# Patient Record
Sex: Female | Born: 1945 | Race: White | Hispanic: No | Smoking: Never smoker
Health system: Southern US, Community
[De-identification: ages and names within clinical notes are randomized; demographics above are authoritative.]

## PROBLEM LIST (undated history)

## (undated) DIAGNOSIS — R319 Hematuria, unspecified: Secondary | ICD-10-CM

## (undated) DIAGNOSIS — C3490 Malignant neoplasm of unspecified part of unspecified bronchus or lung: Secondary | ICD-10-CM

## (undated) DIAGNOSIS — R112 Nausea with vomiting, unspecified: Secondary | ICD-10-CM

## (undated) DIAGNOSIS — Z9889 Other specified postprocedural states: Secondary | ICD-10-CM

## (undated) DIAGNOSIS — J45909 Unspecified asthma, uncomplicated: Secondary | ICD-10-CM

## (undated) DIAGNOSIS — I10 Essential (primary) hypertension: Secondary | ICD-10-CM

## (undated) DIAGNOSIS — R51 Headache: Secondary | ICD-10-CM

## (undated) DIAGNOSIS — M1990 Unspecified osteoarthritis, unspecified site: Secondary | ICD-10-CM

## (undated) HISTORY — PX: TONSILLECTOMY: SUR1361

## (undated) HISTORY — PX: DILATION AND CURETTAGE OF UTERUS: SHX78

## (undated) HISTORY — PX: TUBAL LIGATION: SHX77

## (undated) HISTORY — PX: COLONOSCOPY: SHX174

---

## 1999-12-23 ENCOUNTER — Other Ambulatory Visit: Admission: RE | Admit: 1999-12-23 | Discharge: 1999-12-23 | Payer: Self-pay | Admitting: Obstetrics and Gynecology

## 2000-12-18 ENCOUNTER — Other Ambulatory Visit: Admission: RE | Admit: 2000-12-18 | Discharge: 2000-12-18 | Payer: Self-pay | Admitting: Obstetrics and Gynecology

## 2002-05-19 ENCOUNTER — Other Ambulatory Visit: Admission: RE | Admit: 2002-05-19 | Discharge: 2002-05-19 | Payer: Self-pay | Admitting: Family Medicine

## 2003-05-11 ENCOUNTER — Other Ambulatory Visit: Admission: RE | Admit: 2003-05-11 | Discharge: 2003-05-11 | Payer: Self-pay | Admitting: Family Medicine

## 2004-06-17 ENCOUNTER — Ambulatory Visit: Payer: Self-pay | Admitting: Family Medicine

## 2004-08-22 ENCOUNTER — Ambulatory Visit: Payer: Self-pay | Admitting: Family Medicine

## 2004-08-27 ENCOUNTER — Other Ambulatory Visit
Admission: RE | Admit: 2004-08-27 | Discharge: 2004-08-27 | Payer: Self-pay | Admitting: Certified Registered Nurse Anesthetist

## 2004-08-27 ENCOUNTER — Ambulatory Visit: Payer: Self-pay | Admitting: Family Medicine

## 2004-09-10 ENCOUNTER — Ambulatory Visit: Payer: Self-pay | Admitting: Family Medicine

## 2005-07-04 ENCOUNTER — Ambulatory Visit: Payer: Self-pay | Admitting: Family Medicine

## 2005-08-04 ENCOUNTER — Encounter: Payer: Self-pay | Admitting: Family Medicine

## 2005-09-30 ENCOUNTER — Ambulatory Visit: Payer: Self-pay | Admitting: Family Medicine

## 2005-10-15 ENCOUNTER — Ambulatory Visit: Payer: Self-pay | Admitting: Family Medicine

## 2005-10-15 ENCOUNTER — Other Ambulatory Visit: Admission: RE | Admit: 2005-10-15 | Discharge: 2005-10-15 | Payer: Self-pay | Admitting: Family Medicine

## 2005-10-15 ENCOUNTER — Encounter: Payer: Self-pay | Admitting: Family Medicine

## 2005-10-30 ENCOUNTER — Ambulatory Visit: Payer: Self-pay | Admitting: Gastroenterology

## 2005-12-01 ENCOUNTER — Ambulatory Visit: Payer: Self-pay | Admitting: Gastroenterology

## 2006-05-25 ENCOUNTER — Ambulatory Visit: Payer: Self-pay | Admitting: Family Medicine

## 2006-05-28 ENCOUNTER — Ambulatory Visit: Payer: Self-pay | Admitting: Family Medicine

## 2006-06-04 ENCOUNTER — Ambulatory Visit: Payer: Self-pay | Admitting: Family Medicine

## 2006-06-23 ENCOUNTER — Ambulatory Visit: Payer: Self-pay | Admitting: Family Medicine

## 2006-08-25 ENCOUNTER — Ambulatory Visit: Payer: Self-pay | Admitting: Family Medicine

## 2006-12-18 ENCOUNTER — Ambulatory Visit: Payer: Self-pay | Admitting: Family Medicine

## 2007-03-08 ENCOUNTER — Encounter: Payer: Self-pay | Admitting: Family Medicine

## 2007-04-12 ENCOUNTER — Encounter: Payer: Self-pay | Admitting: Family Medicine

## 2007-04-12 DIAGNOSIS — J45909 Unspecified asthma, uncomplicated: Secondary | ICD-10-CM | POA: Insufficient documentation

## 2007-04-12 DIAGNOSIS — I10 Essential (primary) hypertension: Secondary | ICD-10-CM

## 2007-04-12 DIAGNOSIS — K5730 Diverticulosis of large intestine without perforation or abscess without bleeding: Secondary | ICD-10-CM | POA: Insufficient documentation

## 2007-06-10 ENCOUNTER — Ambulatory Visit: Payer: Self-pay | Admitting: Family Medicine

## 2007-07-26 ENCOUNTER — Telehealth: Payer: Self-pay | Admitting: Family Medicine

## 2007-11-04 ENCOUNTER — Ambulatory Visit: Payer: Self-pay | Admitting: Family Medicine

## 2007-11-04 DIAGNOSIS — N952 Postmenopausal atrophic vaginitis: Secondary | ICD-10-CM

## 2007-11-04 LAB — CONVERTED CEMR LAB
AST: 21 units/L (ref 0–37)
Albumin: 4 g/dL (ref 3.5–5.2)
Alkaline Phosphatase: 66 units/L (ref 39–117)
BUN: 24 mg/dL — ABNORMAL HIGH (ref 6–23)
Bilirubin, Direct: 0.1 mg/dL (ref 0.0–0.3)
Chloride: 104 meq/L (ref 96–112)
Eosinophils Absolute: 0.1 10*3/uL (ref 0.0–0.7)
Eosinophils Relative: 2.6 % (ref 0.0–5.0)
GFR calc Af Amer: 72 mL/min
GFR calc non Af Amer: 60 mL/min
HDL: 56.2 mg/dL (ref >39.0)
MCV: 91.9 fL (ref 78.0–100.0)
Monocytes Relative: 8.5 % (ref 3.0–12.0)
Neutrophils Relative %: 51.1 % (ref 43.0–77.0)
Nitrite: NEGATIVE
Platelets: 203 10*3/uL (ref 150–400)
Potassium: 5.2 meq/L — ABNORMAL HIGH (ref 3.5–5.1)
Protein, U semiquant: NEGATIVE
RDW: 12.5 % (ref 11.5–14.6)
Sodium: 143 meq/L (ref 135–145)
Total CHOL/HDL Ratio: 3.5
Triglycerides: 60 mg/dL (ref 0–149)
VLDL: 12 mg/dL (ref 0–40)
WBC Urine, dipstick: NEGATIVE
WBC: 4.9 10*3/uL (ref 4.5–10.5)

## 2007-11-05 ENCOUNTER — Telehealth: Payer: Self-pay | Admitting: Family Medicine

## 2007-12-14 ENCOUNTER — Encounter: Payer: Self-pay | Admitting: Family Medicine

## 2007-12-15 ENCOUNTER — Encounter: Payer: Self-pay | Admitting: Family Medicine

## 2007-12-15 ENCOUNTER — Other Ambulatory Visit: Admission: RE | Admit: 2007-12-15 | Discharge: 2007-12-15 | Payer: Self-pay | Admitting: Family Medicine

## 2007-12-15 ENCOUNTER — Ambulatory Visit: Payer: Self-pay | Admitting: Family Medicine

## 2007-12-15 DIAGNOSIS — R319 Hematuria, unspecified: Secondary | ICD-10-CM

## 2008-01-03 ENCOUNTER — Encounter: Payer: Self-pay | Admitting: Family Medicine

## 2008-01-03 ENCOUNTER — Ambulatory Visit: Payer: Self-pay | Admitting: Internal Medicine

## 2008-01-20 ENCOUNTER — Telehealth: Payer: Self-pay | Admitting: *Deleted

## 2008-05-11 ENCOUNTER — Ambulatory Visit: Payer: Self-pay | Admitting: Family Medicine

## 2008-09-13 ENCOUNTER — Encounter: Payer: Self-pay | Admitting: Family Medicine

## 2008-10-20 ENCOUNTER — Ambulatory Visit: Payer: Self-pay | Admitting: Family Medicine

## 2008-10-20 DIAGNOSIS — R209 Unspecified disturbances of skin sensation: Secondary | ICD-10-CM | POA: Insufficient documentation

## 2008-10-20 LAB — CONVERTED CEMR LAB
Albumin: 4.1 g/dL (ref 3.5–5.2)
Alkaline Phosphatase: 65 units/L (ref 39–117)
BUN: 17 mg/dL (ref 6–23)
Basophils Absolute: 0 10*3/uL (ref 0.0–0.1)
Basophils Relative: 0.6 % (ref 0.0–3.0)
CO2: 34 meq/L — ABNORMAL HIGH (ref 19–32)
Chloride: 102 meq/L (ref 96–112)
Creatinine, Ser: 1 mg/dL (ref 0.4–1.2)
Eosinophils Absolute: 0.1 10*3/uL (ref 0.0–0.7)
Glucose, Bld: 134 mg/dL — ABNORMAL HIGH (ref 70–99)
HCT: 41.9 % (ref 36.0–46.0)
Hemoglobin: 14.5 g/dL (ref 12.0–15.0)
Lymphocytes Relative: 24.9 % (ref 12.0–46.0)
Lymphs Abs: 1.6 10*3/uL (ref 0.7–4.0)
MCHC: 34.6 g/dL (ref 30.0–36.0)
MCV: 92.2 fL (ref 78.0–100.0)
Monocytes Absolute: 0.3 10*3/uL (ref 0.1–1.0)
Neutro Abs: 4.5 10*3/uL (ref 1.4–7.7)
Potassium: 4.4 meq/L (ref 3.5–5.1)
RBC: 4.55 M/uL (ref 3.87–5.11)
RDW: 12.5 % (ref 11.5–14.6)
TSH: 2.57 microintl units/mL (ref 0.35–5.50)
Total Protein: 6.7 g/dL (ref 6.0–8.3)

## 2008-10-25 ENCOUNTER — Encounter: Admission: RE | Admit: 2008-10-25 | Discharge: 2008-10-25 | Payer: Self-pay | Admitting: Family Medicine

## 2008-10-27 ENCOUNTER — Telehealth: Payer: Self-pay | Admitting: Family Medicine

## 2008-12-07 ENCOUNTER — Encounter: Payer: Self-pay | Admitting: Family Medicine

## 2008-12-18 ENCOUNTER — Encounter: Admission: RE | Admit: 2008-12-18 | Discharge: 2008-12-18 | Payer: Self-pay | Admitting: Neurological Surgery

## 2008-12-18 ENCOUNTER — Encounter: Payer: Self-pay | Admitting: Family Medicine

## 2008-12-28 ENCOUNTER — Encounter: Payer: Self-pay | Admitting: Family Medicine

## 2009-08-04 HISTORY — PX: LYMPH NODE BIOPSY: SHX201

## 2009-09-28 ENCOUNTER — Encounter: Payer: Self-pay | Admitting: Family Medicine

## 2010-01-03 ENCOUNTER — Ambulatory Visit: Payer: Self-pay | Admitting: Family Medicine

## 2010-01-03 LAB — CONVERTED CEMR LAB
Albumin: 3.9 g/dL (ref 3.5–5.2)
Basophils Absolute: 0 10*3/uL (ref 0.0–0.1)
Basophils Relative: 0.7 % (ref 0.0–3.0)
CO2: 30 meq/L (ref 19–32)
Chloride: 108 meq/L (ref 96–112)
Eosinophils Absolute: 0.2 10*3/uL (ref 0.0–0.7)
Glucose, Bld: 83 mg/dL (ref 70–99)
Glucose, Urine, Semiquant: NEGATIVE
Hemoglobin: 13.2 g/dL (ref 12.0–15.0)
Ketones, urine, test strip: NEGATIVE
Lymphs Abs: 1.9 10*3/uL (ref 0.7–4.0)
MCHC: 34.3 g/dL (ref 30.0–36.0)
MCV: 92.8 fL (ref 78.0–100.0)
Monocytes Absolute: 0.4 10*3/uL (ref 0.1–1.0)
Neutro Abs: 2.6 10*3/uL (ref 1.4–7.7)
Nitrite: NEGATIVE
RBC: 4.15 M/uL (ref 3.87–5.11)
RDW: 13.6 % (ref 11.5–14.6)
Sodium: 145 meq/L (ref 135–145)
Specific Gravity, Urine: >=1.03
TSH: 3.23 microintl units/mL (ref 0.35–5.50)
Total CHOL/HDL Ratio: 3
Total Protein: 6.4 g/dL (ref 6.0–8.3)
Triglycerides: 58 mg/dL (ref 0.0–149.0)
WBC Urine, dipstick: NEGATIVE
pH: 6

## 2010-01-08 ENCOUNTER — Ambulatory Visit: Payer: Self-pay | Admitting: Family Medicine

## 2010-01-08 ENCOUNTER — Other Ambulatory Visit: Admission: RE | Admit: 2010-01-08 | Discharge: 2010-01-08 | Payer: Self-pay | Admitting: Family Medicine

## 2010-01-09 ENCOUNTER — Encounter: Payer: Self-pay | Admitting: *Deleted

## 2010-01-10 ENCOUNTER — Encounter: Payer: Self-pay | Admitting: Family Medicine

## 2010-03-14 ENCOUNTER — Ambulatory Visit: Payer: Self-pay | Admitting: Thoracic Surgery (Cardiothoracic Vascular Surgery)

## 2010-03-19 ENCOUNTER — Ambulatory Visit: Payer: Self-pay | Admitting: Thoracic Surgery (Cardiothoracic Vascular Surgery)

## 2010-03-19 ENCOUNTER — Ambulatory Visit (HOSPITAL_COMMUNITY)
Admission: RE | Admit: 2010-03-19 | Discharge: 2010-03-19 | Payer: Self-pay | Admitting: Thoracic Surgery (Cardiothoracic Vascular Surgery)

## 2010-06-12 ENCOUNTER — Encounter: Payer: Self-pay | Admitting: Family Medicine

## 2010-06-12 ENCOUNTER — Ambulatory Visit: Payer: Self-pay | Admitting: Internal Medicine

## 2010-08-24 ENCOUNTER — Other Ambulatory Visit: Payer: Self-pay | Admitting: Thoracic Surgery (Cardiothoracic Vascular Surgery)

## 2010-08-24 DIAGNOSIS — R911 Solitary pulmonary nodule: Secondary | ICD-10-CM

## 2010-09-01 LAB — CONVERTED CEMR LAB: Pap Smear: NEGATIVE

## 2010-09-03 NOTE — Miscellaneous (Signed)
Summary: rx update  Medications Added ESTRACE 0.1 MG/GM  CREA (ESTRADIOL) apply 1 gram,   2 x week       Clinical Lists Changes  Medications: Changed medication from ESTRACE 0.1 MG/GM  CREA (ESTRADIOL) apply a small amout  2 x week to ESTRACE 0.1 MG/GM  CREA (ESTRADIOL) apply 1 gram,   2 x week - Signed Rx of ESTRACE 0.1 MG/GM  CREA (ESTRADIOL) apply 1 gram,   2 x week;  #3 x 11;  Signed;  Entered by: Kern Reap CMA (AAMA);  Authorized by: Roderick Pee MD;  Method used: Electronically to [facility]. #[id number]*, [address] [place], [place], [place]  [address], Ph: [phone], Fax: [phone]    Prescriptions: ESTRACE 0.1 MG/GM  CREA (ESTRADIOL) apply 1 gram,   2 x week  #3 x 11   Entered by:   Kern Reap CMA (AAMA)   Authorized by:   Roderick Pee MD   Signed by:   Kern Reap CMA (AAMA) on 01/10/2010   Method used:   Electronically to        [facility]. [phone]* (retail)       [address]       [place], [place]  [address]       Ph: [phone]       Fax: [phone]   RxID:   [id number]

## 2010-09-03 NOTE — Assessment & Plan Note (Signed)
Summary: CPX // RS   Vital Signs:  Patient profile:   65 year old female Menstrual status:  postmenopausal Height:      62.25 inches Weight:      142 pounds BMI:     25.86 Temp:     98.2 degrees F oral BP sitting:   140 / 80  (left arm) Cuff size:   regular  Vitals Entered By: Kern Reap CMA Duncan Dull) (January 08, 2010 2:56 PM) CC: cpx Is Patient Diabetic? No Pain Assessment Patient in pain? no          Menstrual Status postmenopausal Last PAP Result done   CC:  cpx.  History of Present Illness: Doris Barton is a 65 year old, married female, nonsmoker, who comes in today for evaluation of hypertension, her hypertension is treated with Zestoretic 5 -- 6.25 daily.  BP 140/80.  She uses Proventil p.r.n. for asthma.  She uses Estrace vaginal cream twice weekly for severe vaginal dryness.  She takes calcium, vitamin D, and aspirin tablet and walks on a regular basis.  Tetanus 2009, shingles 2009, seasonal flu 2010, mammogram February 2011, colonoscopy, 2002, normal BMD 01 normal  She continues to be involved in the music .   Her daughter Andrey Campanile, who lives in [place] as [place]  Allergies: No Known Drug Allergies  Past History:  Past medical, surgical, family and social histories (including risk factors) reviewed, and no changes noted (except as noted below).  Past Medical History: Reviewed history from 12/15/2007 and no changes required. Asthma Diverticulosis, colon Hypertension atrophic vaginitis  Family History: Reviewed history from 11/04/2007 and no changes required. her father, Dr. Yolande Jolly died of a CNS hemorrhage.  Her mother died in her 43s of Alzheimer's disease and mild diabetes and asthma.  No brothers two sisters one has allergic rhinitis and asthma.  The other one is obese and has diabetes.  Both have skin cancer  Social History: Reviewed history from 11/04/2007 and no changes required. Occupation: musician Married Never Smoked Alcohol use-no Drug  use-no Regular exercise-yes  Review of Systems      See HPI  Physical Exam  General:  Well-developed,well-nourished,in no acute distress; alert,appropriate and cooperative throughout examination Head:  Normocephalic and atraumatic without obvious abnormalities. No apparent alopecia or balding. Eyes:  No corneal or conjunctival inflammation noted. EOMI. Perrla. Funduscopic exam benign, without hemorrhages, exudates or papilledema. Vision grossly normal. Ears:  External ear exam shows no significant lesions or deformities.  Otoscopic examination reveals clear canals, tympanic membranes are intact bilaterally without bulging, retraction, inflammation or discharge. Hearing is grossly normal bilaterally. Nose:  External nasal examination shows no deformity or inflammation. Nasal mucosa are pink and moist without lesions or exudates. Mouth:  Oral mucosa and oropharynx without lesions or exudates.  Teeth in good repair. Neck:  No deformities, masses, or tenderness noted. Chest Wall:  No deformities, masses, or tenderness noted. Breasts:  No mass, nodules, thickening, tenderness, bulging, retraction, inflamation, nipple discharge or skin changes noted.   Lungs:  Normal respiratory effort, chest expands symmetrically. Lungs are clear to auscultation, no crackles or wheezes. Heart:  Normal rate and regular rhythm. S1 and S2 normal without gallop, murmur, click, rub or other extra sounds. Abdomen:  Bowel sounds positive,abdomen soft and non-tender without masses, organomegaly or hernias noted. Rectal:  No external abnormalities noted. Normal sphincter tone. No rectal masses or tenderness. Genitalia:  Pelvic Exam:        External: normal female genitalia without lesions or masses  Vagina: normal without lesions or masses        Cervix: normal without lesions or masses        Adnexa: normal bimanual exam without masses or fullness        Uterus: normal by palpation        Pap smear:  performed Msk:  No deformity or scoliosis noted of thoracic or lumbar spine.   Pulses:  R and L carotid,radial,femoral,dorsalis pedis and posterior tibial pulses are full and equal bilaterally Extremities:  No clubbing, cyanosis, edema, or deformity noted with normal full range of motion of all joints.   Neurologic:  No cranial nerve deficits noted. Station and gait are normal. Plantar reflexes are down-going bilaterally. DTRs are symmetrical throughout. Sensory, motor and coordinative functions appear intact. Skin:  Intact without suspicious lesions or rashes Cervical Nodes:  No lymphadenopathy noted Axillary Nodes:  No palpable lymphadenopathy Inguinal Nodes:  No significant adenopathy Psych:  Cognition and judgment appear intact. Alert and cooperative with normal attention span and concentration. No apparent delusions, illusions, hallucinations   Impression & Recommendations:  Problem # 1:  Screening PSA (ICD-V76.44) Assessment Unchanged  Problem # 2:  VAGINITIS, ATROPHIC, POSTMENOPAUSAL (ICD-627.3) Assessment: Improved  Her updated medication list for this problem includes:    Estrace 0.1 Mg/gm Crea (Estradiol) .Marland Kitchen... Apply 2 x week  Orders: Prescription Created Electronically [phone])  Problem # 3:  HYPERTENSION (ICD-401.9) Assessment: Improved  Her updated medication list for this problem includes:    Ziac 5-6.25 Mg Tabs (Bisoprolol-hydrochlorothiazide) ..... One by mouth daily  Orders: Prescription Created Electronically [id number]) EKG w/ Interpretation (93000)  Problem # 4:  ASTHMA (ICD-493.90) Assessment: Improved  Her updated medication list for this problem includes:    Proventil 90 Mcg/act Aers (Albuterol) ..... Use as directed  Orders: Prescription Created Electronically [phone])  Complete Medication List: 1)  Ziac 5-6.25 Mg Tabs (Bisoprolol-hydrochlorothiazide) .... One by mouth daily 2)  Proventil 90 Mcg/act Aers (Albuterol) .... Use as directed 3)   Multivitamins Tabs (Multiple vitamin) 4)  Aspirin 325 Mg Tbec (Aspirin) .... Take one tablet daily 5)  Estrace 0.1 Mg/gm Crea (Estradiol) .... Apply 2 x week  Patient Instructions: 1)  continue your current medications. 2)  Consider a foot consult with [facility]oni [facility] at [facility]. Prescriptions: ESTRACE 0.1 MG/GM  CREA (ESTRADIOL) apply 2 x week  #3 tubes x 11   Entered and Authorized by:   Roderick Pee MD   Signed by:   Roderick Pee MD on 01/08/2010   Method used:   Electronically to        [facility]. [phone]* (retail)       [address]       [place], [place]  [address]       Ph: [phone]       Fax: [phone]   RxID:   [id number] PROVENTIL 42 MCG/ACT  AERS (ALBUTEROL) use as directed  #1 x 1   Entered and Authorized by:   Roderick Pee MD   Signed by:   Roderick Pee MD on 01/08/2010   Method used:   Electronically to        [facility]. [phone]* (retail)       [address]       [place], [place]  [address]       Ph: [phone]  Fax: [phone]   RxID:   [id number] ZIAC 5-6.25 MG  TABS (BISOPROLOL-HYDROCHLOROTHIAZIDE) one by mouth daily  #100 x 3   Entered and Authorized by:   Roderick Pee MD   Signed by:   Roderick Pee MD on 01/08/2010   Method used:   Electronically to        [facility]. [phone]* (retail)       [address]       [place], [place]  [address]       Ph: [phone]       Fax: [phone]   RxID:   [id number]

## 2010-09-03 NOTE — Letter (Signed)
Summary: [facility]  [facility]   Imported By: Lanelle Bal 08/28/2009 11:18:56  _____________________________________________________________________  External Attachment:    Type:   Image     Comment:   External Document

## 2010-09-03 NOTE — Miscellaneous (Signed)
Summary: mammogram update   Clinical Lists Changes  Observations: Added new observation of MAMMOGRAM: normal (09/26/2009 8:58)      Preventive Care Screening  Mammogram:    Date:  09/26/2009    Results:  normal

## 2010-09-03 NOTE — Letter (Signed)
Summary: [facility]  [facility]   Imported By: Lanelle Bal 08/28/2009 11:20:46  _____________________________________________________________________  External Attachment:    Type:   Image     Comment:   External Document

## 2010-09-03 NOTE — Miscellaneous (Signed)
Summary: rx update  Medications Added ESTRACE 0.1 MG/GM  CREA (ESTRADIOL) apply a small amout  2 x week       Clinical Lists Changes  Medications: Changed medication from ESTRACE 0.1 MG/GM  CREA (ESTRADIOL) apply 2 x week to ESTRACE 0.1 MG/GM  CREA (ESTRADIOL) apply a small amout  2 x week - Signed Rx of ESTRACE 0.1 MG/GM  CREA (ESTRADIOL) apply a small amout  2 x week;  #3 x 11;  Signed;  Entered by: Kern Reap CMA (AAMA);  Authorized by: Roderick Pee MD;  Method used: Electronically to [facility]. #[id number]*, [address], [place], [place], [place]  [address], Ph: [phone], Fax: [phone]    Prescriptions: ESTRACE 0.1 MG/GM  CREA (ESTRADIOL) apply a small amout  2 x week  #3 x 11   Entered by:   Kern Reap CMA (AAMA)   Authorized by:   Roderick Pee MD   Signed by:   Kern Reap CMA (AAMA) on 01/09/2010   Method used:   Electronically to        [facility]. [phone]* (retail)       [address]       [place], [place]  [address]       Ph: [phone]       Fax: [phone]   RxID:   [id number]

## 2010-09-03 NOTE — Miscellaneous (Signed)
Summary: BONE DENSITY  Clinical Lists Changes  Orders: Added new Test order of T-Bone Densitometry (77080) - Signed Added new Test order of T-Lumbar Vertebral Assessment (77082) - Signed 

## 2010-09-12 ENCOUNTER — Encounter (INDEPENDENT_AMBULATORY_CARE_PROVIDER_SITE_OTHER): Payer: 59 | Admitting: Thoracic Surgery (Cardiothoracic Vascular Surgery)

## 2010-09-12 ENCOUNTER — Ambulatory Visit
Admission: RE | Admit: 2010-09-12 | Discharge: 2010-09-12 | Disposition: A | Discharge status: Home / Self Care | Payer: 59 | Source: Ambulatory Visit | Attending: Thoracic Surgery (Cardiothoracic Vascular Surgery) | Admitting: Thoracic Surgery (Cardiothoracic Vascular Surgery)

## 2010-09-12 DIAGNOSIS — D381 Neoplasm of uncertain behavior of trachea, bronchus and lung: Secondary | ICD-10-CM

## 2010-09-12 DIAGNOSIS — R911 Solitary pulmonary nodule: Secondary | ICD-10-CM

## 2010-09-13 ENCOUNTER — Encounter (INDEPENDENT_AMBULATORY_CARE_PROVIDER_SITE_OTHER): Payer: 59 | Admitting: Thoracic Surgery (Cardiothoracic Vascular Surgery)

## 2010-09-13 DIAGNOSIS — R599 Enlarged lymph nodes, unspecified: Secondary | ICD-10-CM

## 2010-09-13 DIAGNOSIS — D381 Neoplasm of uncertain behavior of trachea, bronchus and lung: Secondary | ICD-10-CM

## 2010-09-13 NOTE — Assessment & Plan Note (Signed)
OFFICE VISIT  LYNAE, PEDERSON DOB:  [birth year]                                        September 12, 2010 CHART #:  [id number]  HISTORY:  The patient is a 65 year old woman who I saw back in August, she was being worked up for hematuria at that time.  CT scan showed a 1- cm irregular nodule in the left lower lobe.  She also had some prominent mediastinal lymph nodes at the upper limits of normal.  We did a PET scan and all that was negative by PET for any hypermetabolic activity. She now returns for a 3-month followup scan with a plan to follow up again in 1 year, 18 months, and 2 years if things remain stable.  She says that her hematuria has resolved.  In the meantime, she had a cold last week she had been caring for her grandchildren that had the usual sneezing, runny nose, cough, low-grade fevers.  Other than that, she has been feeling completely well with no issues.  CURRENT MEDICATIONS: 1. Allergy shots. 2. Aspirin 325 mg daily. 3. Estrace 0.1 mg cream twice weekly. 4. Ziac 5/6.25 one tablet daily. 5. Multivitamin daily.  ALLERGIES:  She has no known drug allergies.  PHYSICAL EXAMINATION:  Vital Signs:  Her blood pressure is 177/88, pulse 63, respirations are 20, and oxygen saturation is 96% on room air. Neck:  There is no cervical or supraclavicular adenopathy.  Lungs: Clear with equal breath sounds.  DIAGNOSTIC TESTS:  CT of the chest is reviewed, and compared to her PET images from August, looking at the left lower lobe nodule looks about the same.  The mediastinal nodes maybe slightly increased in size, but talking about possibly a millimeter or 2 at the max, the scan has not been officially read yet.  IMPRESSION:  The patient is a 65 year old woman with a small left lower lobe nodule and some enlarged mediastinal nodes.  These appear relatively stable over the last 6 months.  I am awaiting the official radiology report and we will call the  patient with that.  Assuming things measure the same, we would plan to re-scan her in 6 months that would be a 1-year followup.  If Radiology feels that anything has grown significantly, then I could discuss for the possibility of more aggressive intervention in the form of biopsies with either endobronchial ultrasound or mediastinoscopy.  Salvatore Decent Dorris Fetch, M.D. Electronically Signed  SCH/MEDQ  D:  09/12/2010  T:  09/13/2010  Job:  [id number]  cc:   Tinnie Gens A. Tawanna Cooler, MD Courtney Paris, M.D.

## 2010-09-14 NOTE — Assessment & Plan Note (Signed)
OFFICE VISIT  Doris, Barton DOB:  [birth year]                                        September 13, 2010 CHART #:  [id number]  Doris Barton returns today.  We have got the report from her CT late after she had left the office yesterday afternoon, and the radiologist felt that the left lung nodule had increased slightly in size going from 10 to 12 mm and also felt that there was some enlargement of the mediastinal lymph nodes with precarinal node going from 1.3 x 1.3 to 1.5 x 1.9 cm and enlargement of the subcarinal node as well.  There also is a new "tree-in-bud" opacity in the right upper lobe that was not present on her PET CT back in August.  I reviewed these findings in detail with Doris Barton and her husband and explained in the differential diagnosis given the negative PET scan and slight growth, this is unlikely to be a malignant etiology, but there is a possibility that this could represent infectious etiology or sarcoidosis as well as an outside chance that there could be a very low-grade malignancy, but I think the first two are much more likely.  I have offered her the option since there was a questionable change in size of moving up to schedule and repeating a CT scan in 3 months versus going ahead and getting biopsies and cultures for a more definitive diagnosis.  I did discuss our options which would be to address the nodes and then possibly the lung lesions if necessary or to address all things at once.  I discussed the various options for approaching each of those with bronchoscopy, endobronchial ultrasound, mediastinoscopy, as well as VATS approach for excisional biopsy of the lung lesion.  My recommendation was that we address the lymph nodes and see if we can get an answer from that standpoint as there is less morbidity and recovery time and essentially no hospital stay with that approach.  I recommended that we proceed with bronchoscopy  and endobronchial ultrasound, and then if pathologist were not confident of getting a definitive diagnosis with the IVUS, we will go ahead and proceed in the same setting with mediastinoscopy.  I discussed in detail with them the nature of procedure, need for general anesthesia, expectation to do this on an outpatient basis.  We did discuss the risks, particularly in regard to mediastinoscopy, which I think likely will be necessary, including those risks associated with general anesthesia as well as the risk of pneumothorax, bleeding potentially and requiring a larger incision to control, recurrent nerve injury with hoarseness, or wound infection.  She understands and accepts these risks.  She wishes to go ahead and find out what process is going on and then we can refer her for appropriate treatment depending on whether this is infectious, inflammatory, or neoplastic in character.  We have tentatively scheduled her for Tuesday, September 24, 2010.  Again she would come in that morning and should be able to go on the same day.  Salvatore Decent Dorris Fetch, M.D. Electronically Signed  SCH/MEDQ  D:  09/13/2010  T:  09/14/2010  Job:  [id number]

## 2010-09-23 ENCOUNTER — Encounter (HOSPITAL_COMMUNITY)
Admission: RE | Admit: 2010-09-23 | Discharge: 2010-09-23 | Disposition: A | Discharge status: Home / Self Care | Payer: 59 | Source: Ambulatory Visit | Attending: Thoracic Surgery (Cardiothoracic Vascular Surgery) | Admitting: Thoracic Surgery (Cardiothoracic Vascular Surgery)

## 2010-09-23 ENCOUNTER — Other Ambulatory Visit: Payer: Self-pay | Admitting: Thoracic Surgery (Cardiothoracic Vascular Surgery)

## 2010-09-23 DIAGNOSIS — Z01810 Encounter for preprocedural cardiovascular examination: Secondary | ICD-10-CM | POA: Insufficient documentation

## 2010-09-23 DIAGNOSIS — Z01818 Encounter for other preprocedural examination: Secondary | ICD-10-CM | POA: Insufficient documentation

## 2010-09-23 DIAGNOSIS — Z9889 Other specified postprocedural states: Secondary | ICD-10-CM

## 2010-09-23 DIAGNOSIS — Z01812 Encounter for preprocedural laboratory examination: Secondary | ICD-10-CM | POA: Insufficient documentation

## 2010-09-23 LAB — COMPREHENSIVE METABOLIC PANEL
ALT: 19 U/L (ref 0–35)
Alkaline Phosphatase: 73 U/L (ref 39–117)
BUN: 19 mg/dL (ref 6–23)
CO2: 28 mEq/L (ref 19–32)
Chloride: 102 mEq/L (ref 96–112)
Glucose, Bld: 89 mg/dL (ref 70–99)
Potassium: 4.5 mEq/L (ref 3.5–5.1)
Total Bilirubin: 0.6 mg/dL (ref 0.3–1.2)

## 2010-09-23 LAB — TYPE AND SCREEN
ABO/RH(D): O POS
Antibody Screen: NEGATIVE

## 2010-09-23 LAB — CBC
HCT: 43 % (ref 36.0–46.0)
Hemoglobin: 14.8 g/dL (ref 12.0–15.0)
RBC: 4.76 MIL/uL (ref 3.87–5.11)
WBC: 7.3 10*3/uL (ref 4.0–10.5)

## 2010-09-24 ENCOUNTER — Other Ambulatory Visit: Payer: Self-pay | Admitting: Thoracic Surgery (Cardiothoracic Vascular Surgery)

## 2010-09-24 ENCOUNTER — Ambulatory Visit (HOSPITAL_COMMUNITY)
Admission: RE | Admit: 2010-09-24 | Discharge: 2010-09-24 | Disposition: A | Discharge status: Home / Self Care | Payer: 59 | Source: Ambulatory Visit | Attending: Thoracic Surgery (Cardiothoracic Vascular Surgery) | Admitting: Thoracic Surgery (Cardiothoracic Vascular Surgery)

## 2010-09-24 ENCOUNTER — Ambulatory Visit (HOSPITAL_COMMUNITY): Payer: 59

## 2010-09-24 DIAGNOSIS — Z01810 Encounter for preprocedural cardiovascular examination: Secondary | ICD-10-CM | POA: Insufficient documentation

## 2010-09-24 DIAGNOSIS — R911 Solitary pulmonary nodule: Secondary | ICD-10-CM | POA: Insufficient documentation

## 2010-09-24 DIAGNOSIS — Z01812 Encounter for preprocedural laboratory examination: Secondary | ICD-10-CM | POA: Insufficient documentation

## 2010-09-24 DIAGNOSIS — D381 Neoplasm of uncertain behavior of trachea, bronchus and lung: Secondary | ICD-10-CM

## 2010-09-24 DIAGNOSIS — J45909 Unspecified asthma, uncomplicated: Secondary | ICD-10-CM | POA: Insufficient documentation

## 2010-09-24 DIAGNOSIS — R599 Enlarged lymph nodes, unspecified: Secondary | ICD-10-CM

## 2010-09-24 DIAGNOSIS — I10 Essential (primary) hypertension: Secondary | ICD-10-CM | POA: Insufficient documentation

## 2010-09-24 DIAGNOSIS — Z01818 Encounter for other preprocedural examination: Secondary | ICD-10-CM | POA: Insufficient documentation

## 2010-09-27 LAB — CULTURE, RESPIRATORY W GRAM STAIN

## 2010-10-02 ENCOUNTER — Encounter (INDEPENDENT_AMBULATORY_CARE_PROVIDER_SITE_OTHER): Payer: Self-pay | Admitting: Thoracic Surgery (Cardiothoracic Vascular Surgery)

## 2010-10-02 DIAGNOSIS — D381 Neoplasm of uncertain behavior of trachea, bronchus and lung: Secondary | ICD-10-CM

## 2010-10-03 NOTE — Assessment & Plan Note (Signed)
OFFICE VISIT  Doris Barton, Doris Barton DOB:  Apr 14, 1946                                        October 02, 2010 CHART #:  [id number]  Doris Barton returns to discuss pathology.  She had a bronchoscopy, EBUS, and mediastinoscopy done on September 24, 2010.  She was originally referred last fall for a left lower lobe nodule and some very mild mediastinal adenopathy.  This was all negative on PET, so we elected to follow her with a repeat CT scan.  She had the repeat CT scan done in early February and it showed an increase in the size of mediastinal nodes and also possible slight increase in the size of his left lower lobe lung nodule.  There also was a new tree and bud infiltrate in the right lung.  We discussed this and I recommended to her that we proceed with some diagnostic sampling of her mediastinal nodes since those were increased in size, it was less invasive than the mass.  The left lower lobe nodule was relatively central, fairly large wedge resection to remove that.  We did that on September 24, 2010, with a bronchoscopy, endobronchial ultrasound, and cytologies, which were negative.  We then proceeded with mediastinoscopy.  [name] states that she is having minimal discomfort.  Her throat was sore for a couple of days, but she is really doing fine.  She is back to full activities.  On exam, her incision is healing well.  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  There was no evidence of malignancy or granulomatous disease.  We discussed our options regarding his left lower lobe mass, again this was negative by PET, showed very limited increase in size, could also be an intrapulmonary lymph node with the same process.  I offered her the option of repeating a CT in about 3 months versus doing VATS wedge resection of the nodule.  We discussed the pros and cons of each approach, the idea of having  a definitive rule out, unfortunately even if we do the wedge on the left lower lobe, she would still need a followup CT for the right lung infiltrate to make sure that is resolving.  We also still have cultures for fungal and AFB that are pending, although the initial results are negative.  After discussion, she wishes to wait and repeat a CT in June.  She is going to discuss this further with her husband this evening.  If she were to change her mind, she will call and we will get her scheduled for that.  Salvatore Decent Dorris Fetch, M.D. Electronically Signed  SCH/MEDQ  D:  10/02/2010  T:  10/03/2010  Job:  [id number]  cc:   Tinnie Gens A. Tawanna Cooler, MD Courtney Paris, M.D.

## 2010-10-08 NOTE — Op Note (Addendum)
NAME:  Doris Barton, SONNEBORN NO.:  [id number]  MEDICAL RECORD NO.:  [id number]           PATIENT TYPE:  O  LOCATION:  [facility]                         FACILITY:  [facility]  PHYSICIAN:  Salvatore Decent. Dorris Fetch, M.D.DATE OF BIRTH:  07/21/1946  DATE OF PROCEDURE:  09/24/2010 DATE OF DISCHARGE:  09/24/2010                              OPERATIVE REPORT   PREOPERATIVE DIAGNOSES:  Mediastinal adenopathy and left lower lobe nodule.  POSTOPERATIVE DIAGNOSES:  Mediastinal adenopathy and left lower lobe nodule.  PROCEDURES:  Bronchoscopy, endobronchial ultrasound, mediastinoscopy.  SURGEON:  Salvatore Decent. Dorris Fetch, MD  ASSISTANT:  None.  ANESTHESIA:  General.  FINDINGS:  Bronchoscopy within normal limits.  Endobronchial ultrasound specimens from level 4R and 7 nodes, rapid prep, atypical cells, level III sent for permanent cytology only.  Mediastinoscopy, enlarged anthracotic vascular nodes, not malignant in appearance.  Multiple biopsies sent.  Frozen section revealed reactive nodes with anthracosis. No malignancy seen.  CLINICAL NOTE:  Doris Barton is a 65 year old woman who was incidentally found to have a small left lung nodule during workup for hematuria back in August 2011.  CT of the chest showed some mediastinal adenopathy and a 1-cm irregular nodule in the left lower lobe.  PET showed no evidence of hypermetabolic activity in either the mediastinum or in the left lower lobe nodule.  A repeat CT was done now at 6 months and showed there has been some enlargement in the mediastinal nodes and also possibly some slight enlargement in the left lower lobe nodule.  The patient was advised to undergo a diagnostic procedure and advised her to start with bronchoscopy, endobronchial ultrasound, and possible mediastinoscopy.  The indications, risks, benefits, and alternatives were discussed in detail with the patient, she understood and accepted risks and agreed to  proceed.  OPERATIVE NOTE:  Doris Barton was brought to the preop holding area on September 24, 2010.  There, Anesthesia established intravenous access and then placed an arterial blood pressure monitoring line.  The patient was taken to the operating room, anesthetized, and intubated.  A flexible fiberoptic bronchoscopy was carried out via the endotracheal tube. There was normal endobronchial anatomy and no endobronchial lesions. The endobronchial ultrasound probe then was passed through the endotracheal tube using Doppler scanning to identify the blood vessels to prevent an inadvertent passage of the needle into the vessels.  Nodes were identified in the level IV location initially.  The needle was passed into the node, visualized on ultrasound, several passes were made.  The needle was withdrawn.  Specimens were sent for quick prep and permanent cytology.  Next, a level VII lymph node was visualized and sampled in a similar fashion.  While awaiting the results of the quick prep on those nodes, a level III node was identified and sampled as well.  This was sent only for permanent cytology.  Quick prep returned with atypical cells, but was nondiagnostic and decision was made to proceed with mediastinoscopy as had been discussed with the patient preoperatively.  Intravenous antibiotics were administered.  The patient's neck and chest were prepped and draped in usual sterile fashion.  An incision was made  one fingerbreadth above the sternal notch, it was carried through the skin and subcutaneous tissue. Hemostasis was achieved with electrocautery.  The strap muscles were separated in the midline.  The pretracheal fascia was identified and incised and the pretracheal plane was developed bluntly into the mediastinum.  Mediastinoscope was passed into the chest.  A level IV node was visible, this appeared enlarged, but was anthracotic, it did not appear malignant.  Biopsies were taken.  The  node was relatively avascular.  Hemostasis was achieved with electrocautery.  The largest of the nodes was in the level III location.  There were actually multiple nodes in all stations, the largest of the level III nodes was biopsied. This and the 4R node were sent for frozen sections.  The subcarinal nodes then were identified.  There were some inflammatory changes around the nodes.  Biopsies were taken and sent for both fungal and AFB cultures as well as permanent pathology.  The scope then was withdrawn slightly additional 4R and III lymph nodes were biopsied, again sending specimens both for fungal and AFB cultures and permanent pathology. Frozen section returned with reactive nodes with anthracosis, but no granulomas or tumor was seen.  Additional biopsies were taken to be sent for flow cytometry.  The wound was packed with gauze for 5 minutes. Surgicel was placed in the subcarinal space.  The gauze was removed. The mediastinoscope was inserted.  The final inspection for hemostasis was carried out.  The mediastinoscope was withdrawn.  The incision was closed with an interrupted 3-0 Vicryl sutures in the platysma layer followed by a 4-0 Vicryl subcuticular suture.  Dermabond was applied to the skin.  All sponge, needle, and instrument counts were correct at the end of the procedure.  The patient was taken from the operating room to the postanesthetic care unit extubated and in good condition.     Salvatore Decent Dorris Fetch, M.D.     SCH/MEDQ  D:  09/24/2010  T:  09/25/2010  Job:  [id number]  cc:   Tinnie Gens A. Tawanna Cooler, MD  Electronically Signed by Charlett Lango M.D. on 10/08/2010 12:27:17 PM Electronically Signed by Charlett Lango M.D. on 10/08/2010 12:52:35 PM Electronically Signed by Charlett Lango M.D. on 10/08/2010 01:10:37 PM Electronically Signed by Charlett Lango M.D. on 10/08/2010 01:32:05 PM Electronically Signed by Charlett Lango M.D. on 10/08/2010 01:52:50  PM Electronically Signed by Charlett Lango M.D. on 10/08/2010 02:12:45 PM Electronically Signed by Charlett Lango M.D. on 10/08/2010 02:33:42 PM Electronically Signed by Charlett Lango M.D. on 10/08/2010 02:57:03 PM Electronically Signed by Charlett Lango M.D. on 10/08/2010 03:21:54 PM Electronically Signed by Charlett Lango M.D. on 10/08/2010 03:46:09 PM Electronically Signed by Charlett Lango M.D. on 10/08/2010 04:23:54 PM Electronically Signed by Charlett Lango M.D. on 10/08/2010 04:55:12 PM Electronically Signed by Charlett Lango M.D. on 10/08/2010 04:55:12 PM Electronically Signed by Charlett Lango M.D. on 10/08/2010 07:32:31 PM

## 2010-10-18 LAB — GLUCOSE, CAPILLARY: Glucose-Capillary: 99 mg/dL (ref 70–99)

## 2010-10-22 LAB — FUNGUS CULTURE W SMEAR
Fungal Smear: NONE SEEN
Fungal Smear: NONE SEEN

## 2010-10-25 ENCOUNTER — Encounter: Payer: Self-pay | Admitting: Family Medicine

## 2010-11-08 LAB — AFB CULTURE WITH SMEAR (NOT AT ARMC): Acid Fast Smear: NONE SEEN

## 2010-12-03 ENCOUNTER — Other Ambulatory Visit: Payer: Self-pay | Admitting: Thoracic Surgery (Cardiothoracic Vascular Surgery)

## 2010-12-03 DIAGNOSIS — C3490 Malignant neoplasm of unspecified part of unspecified bronchus or lung: Secondary | ICD-10-CM

## 2010-12-17 NOTE — Consult Note (Signed)
NEW PATIENT CONSULTATION   GURTHA, PICKER  DOB:  12/20/1945                                        March 14, 2010  CHART #:  [id number]   CHIEF COMPLAINT:  Left lung nodule.   HISTORY OF PRESENT ILLNESS:  The patient is a 65 year old woman who  recently developed hematuria.  She is being evaluated by Dr. Tawanna Cooler and  Dr. Wanda Plump.  During the workup, a CT scan was done and on the lower  portion of the chest cuts, a left lung nodule was noted.  A chest CT  then was performed, which showed a 1-cm irregular nodule in the left  lower lobe.  There were some prominent mediastinal lymph nodes, all at  the upper limits of normal, but no definite pathologic adenopathy.  The  patient has a history of asthma, but this has been well controlled with  allergy shots.  She has only had the use of Proventil inhaler one time  in the past year.  She has not had any cough or hemoptysis.  She denies  any weight loss, fevers, chills, or sweats or recent bronchitis or  pneumonia.   PAST MEDICAL HISTORY:  Significant for asthma, arthritis, hypertension,  migraine headaches, dysuria, and hematuria.   PAST SURGICAL HISTORY:  Significant for tonsillectomy, tubal ligation,  and dilatation and curettage.   CURRENT MEDICATIONS:  1. Allergy shots every other week.  2. Aspirin 325 mg daily.  3. Estrace 0.1 mg/g vaginal cream.  4. Proventil inhaler 2 puffs b.i.d. as needed.  5. Bactrim DS 1 p.o. b.i.d.  6. Ziac 5/6.25 one tablet daily.   ALLERGIES:  She has no known drug allergies.   FAMILY HISTORY:  Significant for Alzheimer disease.  No significant  family history of cancer.   SOCIAL HISTORY:  She is married.  She is a retired Scientist, research (physical sciences).  She has never smoked, but her husband smoked heavily and she  was exposed to secondhand smoke up until about 5-10 years ago.  She  remains active.   REVIEW OF SYSTEMS:  Urologic symptoms of dysuria and hematuria as noted.  All other systems are negative.   PHYSICAL EXAMINATION:  General:  The patient is a well-appearing 64-year-  old woman, in no acute distress.  She is well developed and well  nourished.  Blood pressure is 160/80, pulse 70, respirations are 16, and  her oxygen saturation is 95% on room air.  Neurological:  She is alert  and oriented x3 with no focal deficits.  HEENT:  Unremarkable.  Neck:  Supple without thyromegaly, adenopathy, or bruits.  Cardiac:  Regular  rate and rhythm.  Normal S1 and S2.  No rubs, murmurs, or gallops.  Lungs:  Clear with equal breath sounds bilaterally.  Abdomen:  Soft and  nontender.  Extremities:  Without clubbing, cyanosis, or edema.   LABORATORY DATA:  CT of the chest was reviewed.  Findings as previously  noted.  BUN and creatinine on February 19, 2010, were 18 and 0.5.  Urinalysis dipstick was positive for microscopic blood.   IMPRESSION:  The patient is a 65 year old nonsmoker, but with some  exposure to secondhand smoke, who has been incidentally found to have a  1-cm left lower lobe nodule on a CT scan was done during the workup of  hematuria, which  apparently is due to some inflammatory lesion in her  bladder, although I do not think that has been definitively determined  at this point in time.  On CT of the chest, the nodule is present in the  left lower lobe and there is some borderline mediastinal adenopathy as  well, although no definite pathologic adenopathy.  I had a long  discussion with the patient regarding the differential diagnosis, which  could be infection, inflammation, or neoplasm either benign or  malignant.  She does have some risk for primary lung cancer given her  age as well as some significant secondhand smoke exposure over many  years.   I discussed our diagnostic options; one option would be to follow with  serial CT scans to look for any changes, second option would be to  proceed with a CT-guided needle biopsy, which I think would  be very low  yield given the small size and central location of electromagnetic  navigational bronchoscopy for biopsy would be a possibility, although  would be less than definitive.  The final option would be to do a  surgical excisional biopsy with a video-assisted thoracoscopy.   Prior to any attempt for invasive diagnosis, I recommended that we do a  PET/CT particularly in the setting of this mediastinal adenopathy.  If  the lesion and/or the mediastinal nodes were hypermetabolic, then I  would recommend an aggressive approach.  If none of those are positive  on PET, I think we could safely follow this if she so desires.  We are  going to schedule a PET scan and then I will follow up with her in the  office next week to discuss the findings and our next step.   Salvatore Decent Dorris Fetch, M.D.  Electronically Signed   SCH/MEDQ  D:  03/14/2010  T:  03/15/2010  Job:  [id number]   cc:   Courtney Paris, M.D.  Jeffrey A. Tawanna Cooler, MD

## 2010-12-17 NOTE — Assessment & Plan Note (Signed)
OFFICE VISIT   EVITA, MERIDA  DOB:  [birth year]                                        March 19, 2010  CHART #:  [id number]   The patient is a 65 year old woman I saw in the office last week.  She  recently was found to have a 1-cm nodule in her left lower lobe.  This  was noted on an abdominal CT that was being done to evaluate hematuria.  I discussed the options with the patient which would be to follow  radiographically with serial CT scans, proceed with a needle or VATS  excisional biopsy or to do a PET scan to see if there was any increased  metabolic activity in the lesion.  After discussion, she opted to  proceed with PET-CT at my suggestion, that was done today.  The PET-CT  showed no metabolic uptake in the lesion in her left lower lobe making  this extremely unlikely to be a lung cancer.  There is a slight chance  this could be a very slow growing bronchoalveolar cell, but even those  will usually light up if they are solid and of this size.  I again  discussed options with the patient and recommended to her that we repeat  a scan in 6 months since she is a nonsmoker and really a low risk for  primary lung cancer.  We could wait up to 12 months, but I think she is  more comfortable with a 65-month followup.  We will arrange that in the  office.  She can call at any time if she has any other questions.   Salvatore Decent Dorris Fetch, M.D.  Electronically Signed   SCH/MEDQ  D:  03/19/2010  T:  03/20/2010  Job:  [id number]   cc:   Courtney Paris, M.D.  Jeffrey A. Tawanna Cooler, MD

## 2011-01-30 ENCOUNTER — Ambulatory Visit
Admission: RE | Admit: 2011-01-30 | Discharge: 2011-01-30 | Disposition: A | Discharge status: Home / Self Care | Payer: Managed Care, Other (non HMO) | Source: Ambulatory Visit | Attending: Thoracic Surgery (Cardiothoracic Vascular Surgery) | Admitting: Thoracic Surgery (Cardiothoracic Vascular Surgery)

## 2011-01-30 ENCOUNTER — Encounter (INDEPENDENT_AMBULATORY_CARE_PROVIDER_SITE_OTHER): Payer: Managed Care, Other (non HMO) | Admitting: Thoracic Surgery (Cardiothoracic Vascular Surgery)

## 2011-01-30 ENCOUNTER — Other Ambulatory Visit: Payer: Self-pay | Admitting: Family Medicine

## 2011-01-30 DIAGNOSIS — C3490 Malignant neoplasm of unspecified part of unspecified bronchus or lung: Secondary | ICD-10-CM

## 2011-01-30 DIAGNOSIS — D381 Neoplasm of uncertain behavior of trachea, bronchus and lung: Secondary | ICD-10-CM

## 2011-01-31 NOTE — Assessment & Plan Note (Signed)
OFFICE VISIT  NOAM, FRANZEN DOB:  21-May-1946                                        January 30, 2011 CHART #:  [id number]  REASON FOR VISIT:  Followup left lower lobe nodule.  Ms. Spainhower is a 65 year old woman who I have been following for the past year originally for a left lower lobe nodule.  This was about 10-12 mm in size.  Initially in August and then February, she had a repeat CT which showed possibly a slight increase in size of the left lung nodule. There was significant enlargement of some of her mediastinal nodes, now also with a new tree in bud opacity in the right upper lobe.  Of note, she had had a PET when she was first found to have this left lower lobe nodule and that was metabolically negative.  Given her adenopathy, we went ahead and did a bronchoscopy and endobronchial ultrasound on her and mediastinoscopy.  Pathology of note showed sinus histiocytosis.  No evidence of malignancy or granulomatous disease.  All cultures were eventually negative.  She now returns for 36-month followup with a repeat CT.  She has been feeling well in the interim.  She has had no cough, shortness of breath, wheezing, or hemoptysis.  PAST MEDICAL HISTORY:  Significant for asthma, arthritis, hypertension, migraine headaches, dysuria, hematuria, left lower lobe nodule, and mediastinal adenopathy.  CURRENT MEDICATIONS: 1. Aspirin 325 mg daily. 2. Estrace 0.1 mg cream applied twice weekly. 3. Ziac 5/6.25 one tablet daily. 4. Multivitamin once daily.  She has no known drug allergies.  FAMILY AND SOCIAL HISTORY:  Unchanged.  She has never smoked, but did have some remote second-hands smoke exposure.  REVIEW OF SYSTEMS:  All systems are negative.  She feels well and has not had any recent problems.  PHYSICAL EXAMINATION:  Ms. Urich is a well-appearing 65 year old woman in no acute distress. VITAL SIGNS:  Her blood pressure is 152/80, pulse 63,  respirations 14, and oxygen saturation 98% on room air. LUNGS:  Clear with equal breath sounds.  No palpable adenopathy.  LABORATORY DATA:  CT of the chest is reviewed and has not been officially read as of yet, but in comparison of her CT from February the tree in bud changes in the right upper lobe have resolved.  Her mediastinal adenopathy has essentially resolved.  The left lower lobe nodule appears unchanged, approximately 11 mm in diameter.  IMPRESSION:  Ms. Magner is a 65 year old woman who now is about a year out from initially being found to have left lower lobe lung nodule.  She subsequently developed some mediastinal adenopathy which was benign and tree in bud changes in the right lung which have subsequently resolved without specific treatment.  The left lower lobe nodule persists.  It appears unchanged from the CT today.  I recommended that we repeat a CT scan in 6 months.  We will continue to follow this for 2 years before consider it definitively benign.  We will await the radiologist's official reading on the CT.  Salvatore Decent Dorris Fetch, M.D. Electronically Signed  SCH/MEDQ  D:  01/30/2011  T:  01/31/2011  Job:  [id number]  cc:   Tinnie Gens A. Tawanna Cooler, MD

## 2011-02-13 ENCOUNTER — Other Ambulatory Visit: Payer: Self-pay | Admitting: Thoracic Surgery (Cardiothoracic Vascular Surgery)

## 2011-02-13 DIAGNOSIS — D381 Neoplasm of uncertain behavior of trachea, bronchus and lung: Secondary | ICD-10-CM

## 2011-03-17 ENCOUNTER — Other Ambulatory Visit: Payer: Managed Care, Other (non HMO)

## 2011-03-17 ENCOUNTER — Ambulatory Visit: Payer: Managed Care, Other (non HMO) | Admitting: Thoracic Surgery (Cardiothoracic Vascular Surgery)

## 2011-05-27 ENCOUNTER — Other Ambulatory Visit: Payer: Self-pay | Admitting: Family Medicine

## 2011-06-18 ENCOUNTER — Other Ambulatory Visit: Payer: Self-pay | Admitting: Thoracic Surgery (Cardiothoracic Vascular Surgery)

## 2011-06-18 DIAGNOSIS — D381 Neoplasm of uncertain behavior of trachea, bronchus and lung: Secondary | ICD-10-CM

## 2011-07-16 ENCOUNTER — Ambulatory Visit (INDEPENDENT_AMBULATORY_CARE_PROVIDER_SITE_OTHER): Payer: Medicare Other | Admitting: Thoracic Surgery (Cardiothoracic Vascular Surgery)

## 2011-07-16 ENCOUNTER — Ambulatory Visit: Payer: Medicare Other | Admitting: Thoracic Surgery (Cardiothoracic Vascular Surgery)

## 2011-07-16 ENCOUNTER — Ambulatory Visit
Admission: RE | Admit: 2011-07-16 | Discharge: 2011-07-16 | Disposition: A | Discharge status: Home / Self Care | Payer: Medicare Other | Source: Ambulatory Visit | Attending: Thoracic Surgery (Cardiothoracic Vascular Surgery) | Admitting: Thoracic Surgery (Cardiothoracic Vascular Surgery)

## 2011-07-16 ENCOUNTER — Encounter: Payer: Self-pay | Admitting: Thoracic Surgery (Cardiothoracic Vascular Surgery)

## 2011-07-16 VITALS — BP 158/82 | HR 65 | Resp 20 | Ht 62.0 in | Wt 140.0 lb

## 2011-07-16 DIAGNOSIS — J984 Other disorders of lung: Secondary | ICD-10-CM

## 2011-07-16 DIAGNOSIS — R911 Solitary pulmonary nodule: Secondary | ICD-10-CM

## 2011-07-16 DIAGNOSIS — R590 Localized enlarged lymph nodes: Secondary | ICD-10-CM

## 2011-07-16 DIAGNOSIS — R599 Enlarged lymph nodes, unspecified: Secondary | ICD-10-CM

## 2011-07-16 DIAGNOSIS — D381 Neoplasm of uncertain behavior of trachea, bronchus and lung: Secondary | ICD-10-CM

## 2011-07-16 NOTE — Progress Notes (Signed)
HPI: Ms. Cavanah returns today for followup of her left lower lobe nodule. This was first found in August of 2011 during a workup for hematuria. An abdominal CT was done and on the upper cuts of the scan a small nodule was note in the LLL.  On 6 month followup scan there is a question of some slight enlargement of the lower lobe nodules as well as mediastinal nodes with an endobronchial ultrasound and mediastinoscopy and found the nodes to be benign with sinus histocytosis. That was in February of this year. She was subsequently seen in June, her mediastinal adenopathy had decreased in size and the nodule was unchanged.  Since her last visit she's been feeling well. She's not had any issues with her breathing, she denies any persistent fevers, chills or sweats  Current Outpatient Prescriptions  Medication Sig Dispense Refill  . aspirin 325 MG EC tablet Take 325 mg by mouth daily.        . bisoprolol-hydrochlorothiazide (ZIAC) 5-6.25 MG per tablet TAKE 1 TABLET BY MOUTH ONCE DAILY  100 tablet  3  . EPIPEN 2-PAK 0.3 MG/0.3ML DEVI Inject 0.3 mg into the skin once.       Marland Kitchen ESTRACE VAGINAL 0.1 MG/GM vaginal cream APPLY TWICE WEEKLY AS DIRECTED  127.5 g  11     Physical Exam: BP 158/82  Pulse 65  Resp 20  Ht 5\' 2"  (1.575 m)  Wt 140 lb (63.504 kg)  BMI 25.61 kg/m2  SpO2 98% Lungs clear with equal breath sounds bilaterally No cervical or subclavicular adenopathy  Diagnostic Tests: CT of the chest is reviewed, there is been no change in the size or morphology of the left lower lobe nodule in the interim. The radiologist felt that an AP window node may have increased in size from 8 to 10 mm, although it appears unchanged and measures the same to my reading.  Impression: 65 year old woman with a left lower lobe nodule, this is been present for about 18 months now with no significant change in size or morphology. There is a question of a slight increase in size of mediastinal node, however this  does not appear to be significant and that node is certainly not pathologic in size or appearance  Plan: Return in 6 months with a CT of the chest for 2 year followup

## 2011-07-23 ENCOUNTER — Ambulatory Visit: Payer: Medicare Other | Admitting: Thoracic Surgery (Cardiothoracic Vascular Surgery)

## 2011-07-23 ENCOUNTER — Other Ambulatory Visit: Payer: Medicare Other

## 2011-08-07 DIAGNOSIS — J309 Allergic rhinitis, unspecified: Secondary | ICD-10-CM | POA: Diagnosis not present

## 2011-08-11 DIAGNOSIS — J309 Allergic rhinitis, unspecified: Secondary | ICD-10-CM | POA: Diagnosis not present

## 2011-08-19 DIAGNOSIS — J309 Allergic rhinitis, unspecified: Secondary | ICD-10-CM | POA: Diagnosis not present

## 2011-09-01 DIAGNOSIS — J309 Allergic rhinitis, unspecified: Secondary | ICD-10-CM | POA: Diagnosis not present

## 2011-09-04 DIAGNOSIS — J309 Allergic rhinitis, unspecified: Secondary | ICD-10-CM | POA: Diagnosis not present

## 2011-09-09 DIAGNOSIS — J309 Allergic rhinitis, unspecified: Secondary | ICD-10-CM | POA: Diagnosis not present

## 2011-09-15 DIAGNOSIS — J309 Allergic rhinitis, unspecified: Secondary | ICD-10-CM | POA: Diagnosis not present

## 2011-09-24 DIAGNOSIS — J309 Allergic rhinitis, unspecified: Secondary | ICD-10-CM | POA: Diagnosis not present

## 2011-10-21 DIAGNOSIS — J309 Allergic rhinitis, unspecified: Secondary | ICD-10-CM | POA: Diagnosis not present

## 2011-10-24 DIAGNOSIS — Z1231 Encounter for screening mammogram for malignant neoplasm of breast: Secondary | ICD-10-CM | POA: Diagnosis not present

## 2011-11-10 DIAGNOSIS — J309 Allergic rhinitis, unspecified: Secondary | ICD-10-CM | POA: Diagnosis not present

## 2011-11-17 DIAGNOSIS — J309 Allergic rhinitis, unspecified: Secondary | ICD-10-CM | POA: Diagnosis not present

## 2011-11-20 DIAGNOSIS — J309 Allergic rhinitis, unspecified: Secondary | ICD-10-CM | POA: Diagnosis not present

## 2011-12-02 DIAGNOSIS — J309 Allergic rhinitis, unspecified: Secondary | ICD-10-CM | POA: Diagnosis not present

## 2011-12-05 DIAGNOSIS — J309 Allergic rhinitis, unspecified: Secondary | ICD-10-CM | POA: Diagnosis not present

## 2011-12-08 DIAGNOSIS — J309 Allergic rhinitis, unspecified: Secondary | ICD-10-CM | POA: Diagnosis not present

## 2011-12-09 DIAGNOSIS — R3129 Other microscopic hematuria: Secondary | ICD-10-CM | POA: Diagnosis not present

## 2011-12-12 ENCOUNTER — Other Ambulatory Visit: Payer: Self-pay | Admitting: Thoracic Surgery (Cardiothoracic Vascular Surgery)

## 2011-12-12 DIAGNOSIS — J309 Allergic rhinitis, unspecified: Secondary | ICD-10-CM | POA: Diagnosis not present

## 2011-12-12 DIAGNOSIS — D381 Neoplasm of uncertain behavior of trachea, bronchus and lung: Secondary | ICD-10-CM

## 2011-12-22 DIAGNOSIS — J309 Allergic rhinitis, unspecified: Secondary | ICD-10-CM | POA: Diagnosis not present

## 2012-01-06 DIAGNOSIS — J309 Allergic rhinitis, unspecified: Secondary | ICD-10-CM | POA: Diagnosis not present

## 2012-01-07 DIAGNOSIS — R310 Gross hematuria: Secondary | ICD-10-CM | POA: Diagnosis not present

## 2012-01-07 DIAGNOSIS — R3129 Other microscopic hematuria: Secondary | ICD-10-CM | POA: Diagnosis not present

## 2012-01-08 DIAGNOSIS — R3129 Other microscopic hematuria: Secondary | ICD-10-CM | POA: Diagnosis not present

## 2012-01-19 DIAGNOSIS — J309 Allergic rhinitis, unspecified: Secondary | ICD-10-CM | POA: Diagnosis not present

## 2012-01-20 ENCOUNTER — Ambulatory Visit
Admission: RE | Admit: 2012-01-20 | Discharge: 2012-01-20 | Disposition: A | Discharge status: Home / Self Care | Payer: Medicare Other | Source: Ambulatory Visit | Attending: Thoracic Surgery (Cardiothoracic Vascular Surgery) | Admitting: Thoracic Surgery (Cardiothoracic Vascular Surgery)

## 2012-01-20 ENCOUNTER — Encounter: Payer: Self-pay | Admitting: Thoracic Surgery (Cardiothoracic Vascular Surgery)

## 2012-01-20 ENCOUNTER — Ambulatory Visit (INDEPENDENT_AMBULATORY_CARE_PROVIDER_SITE_OTHER): Payer: Medicare Other | Admitting: Thoracic Surgery (Cardiothoracic Vascular Surgery)

## 2012-01-20 VITALS — BP 145/82 | HR 56 | Resp 17 | Ht 62.0 in | Wt 139.0 lb

## 2012-01-20 DIAGNOSIS — J984 Other disorders of lung: Secondary | ICD-10-CM | POA: Diagnosis not present

## 2012-01-20 DIAGNOSIS — R911 Solitary pulmonary nodule: Secondary | ICD-10-CM | POA: Diagnosis not present

## 2012-01-20 DIAGNOSIS — D381 Neoplasm of uncertain behavior of trachea, bronchus and lung: Secondary | ICD-10-CM

## 2012-01-20 NOTE — Progress Notes (Signed)
  HPI:  Doris Barton returns for a scheduled followup of a left lower lobe nodule. This was first seen in 2011. Was found incidentally on a CT of the abdomen which was done to evaluate hematuria. She was last seen 6 months ago at which time there was no change in the nodule.  In the interim since her last visit she's been feeling well. Denies chest pain, shortness of breath, coughing, wheezing.  Past medical history Hypertension Asthma Diverticulosis Hematuria- resolved Vaginitis Left lower lobe nodule  Current Outpatient Prescriptions  Medication Sig Dispense Refill  . aspirin 325 MG EC tablet Take 325 mg by mouth daily.        . bisoprolol-hydrochlorothiazide (ZIAC) 5-6.25 MG per tablet TAKE 1 TABLET BY MOUTH ONCE DAILY  100 tablet  3  . EPIPEN 2-PAK 0.3 MG/0.3ML DEVI Inject 0.3 mg into the skin once.       Marland Kitchen ESTRACE VAGINAL 0.1 MG/GM vaginal cream APPLY TWICE WEEKLY AS DIRECTED  127.5 g  11    Physical Exam BP 145/82  Pulse 56  Resp 17  Ht 5\' 2"  (1.575 m)  Wt 139 lb (63.05 kg)  BMI 25.42 kg/m2  SpO2 98% General well-developed well-nourished 66 year old woman in no acute distress Lungs clear Cardiac regular rate and rhythm no rubs or murmurs  Diagnostic Tests: CT Chest 01/20/12 IMPRESSION:  Unchanged 9 mm nodule left lower lobe. 2 years of stability is  documented, however the ground-glass appearance warrants continued  follow-up at 9-12 months as the differential includes  adenocarcinoma in situ (previously termed bronchioalveolar cell  carcinoma).  Impression: 66 year old woman, nonsmoker, with a 1 cm nodule left lower lobe which has been stable for 2 years.   Plan: I will see her back in one year for another CT of the chest because of small possibility that this could be adenocarcinoma in situ.

## 2012-02-02 ENCOUNTER — Other Ambulatory Visit: Payer: Self-pay | Admitting: Family Medicine

## 2012-02-02 DIAGNOSIS — J309 Allergic rhinitis, unspecified: Secondary | ICD-10-CM | POA: Diagnosis not present

## 2012-02-10 DIAGNOSIS — J309 Allergic rhinitis, unspecified: Secondary | ICD-10-CM | POA: Diagnosis not present

## 2012-02-16 DIAGNOSIS — J309 Allergic rhinitis, unspecified: Secondary | ICD-10-CM | POA: Diagnosis not present

## 2012-02-24 DIAGNOSIS — J309 Allergic rhinitis, unspecified: Secondary | ICD-10-CM | POA: Diagnosis not present

## 2012-03-02 DIAGNOSIS — J309 Allergic rhinitis, unspecified: Secondary | ICD-10-CM | POA: Diagnosis not present

## 2012-03-08 DIAGNOSIS — J309 Allergic rhinitis, unspecified: Secondary | ICD-10-CM | POA: Diagnosis not present

## 2012-03-11 DIAGNOSIS — H2510 Age-related nuclear cataract, unspecified eye: Secondary | ICD-10-CM | POA: Diagnosis not present

## 2012-03-11 DIAGNOSIS — H43399 Other vitreous opacities, unspecified eye: Secondary | ICD-10-CM | POA: Diagnosis not present

## 2012-03-11 DIAGNOSIS — H43819 Vitreous degeneration, unspecified eye: Secondary | ICD-10-CM | POA: Diagnosis not present

## 2012-03-15 DIAGNOSIS — J309 Allergic rhinitis, unspecified: Secondary | ICD-10-CM | POA: Diagnosis not present

## 2012-03-22 DIAGNOSIS — J309 Allergic rhinitis, unspecified: Secondary | ICD-10-CM | POA: Diagnosis not present

## 2012-03-29 DIAGNOSIS — J309 Allergic rhinitis, unspecified: Secondary | ICD-10-CM | POA: Diagnosis not present

## 2012-04-07 DIAGNOSIS — Z23 Encounter for immunization: Secondary | ICD-10-CM | POA: Diagnosis not present

## 2012-04-12 DIAGNOSIS — J309 Allergic rhinitis, unspecified: Secondary | ICD-10-CM | POA: Diagnosis not present

## 2012-04-26 DIAGNOSIS — J309 Allergic rhinitis, unspecified: Secondary | ICD-10-CM | POA: Diagnosis not present

## 2012-05-10 DIAGNOSIS — J309 Allergic rhinitis, unspecified: Secondary | ICD-10-CM | POA: Diagnosis not present

## 2012-05-18 DIAGNOSIS — J3081 Allergic rhinitis due to animal (cat) (dog) hair and dander: Secondary | ICD-10-CM | POA: Diagnosis not present

## 2012-05-18 DIAGNOSIS — J301 Allergic rhinitis due to pollen: Secondary | ICD-10-CM | POA: Diagnosis not present

## 2012-05-18 DIAGNOSIS — J3089 Other allergic rhinitis: Secondary | ICD-10-CM | POA: Diagnosis not present

## 2012-05-18 DIAGNOSIS — H1045 Other chronic allergic conjunctivitis: Secondary | ICD-10-CM | POA: Diagnosis not present

## 2012-05-25 DIAGNOSIS — J309 Allergic rhinitis, unspecified: Secondary | ICD-10-CM | POA: Diagnosis not present

## 2012-05-31 ENCOUNTER — Other Ambulatory Visit: Payer: Self-pay | Admitting: Family Medicine

## 2012-06-21 DIAGNOSIS — J309 Allergic rhinitis, unspecified: Secondary | ICD-10-CM | POA: Diagnosis not present

## 2012-06-28 DIAGNOSIS — J309 Allergic rhinitis, unspecified: Secondary | ICD-10-CM | POA: Diagnosis not present

## 2012-07-05 DIAGNOSIS — J309 Allergic rhinitis, unspecified: Secondary | ICD-10-CM | POA: Diagnosis not present

## 2012-07-19 DIAGNOSIS — J309 Allergic rhinitis, unspecified: Secondary | ICD-10-CM | POA: Diagnosis not present

## 2012-07-20 DIAGNOSIS — J309 Allergic rhinitis, unspecified: Secondary | ICD-10-CM | POA: Diagnosis not present

## 2012-07-26 ENCOUNTER — Other Ambulatory Visit (INDEPENDENT_AMBULATORY_CARE_PROVIDER_SITE_OTHER): Payer: Medicare Other

## 2012-07-26 DIAGNOSIS — J309 Allergic rhinitis, unspecified: Secondary | ICD-10-CM | POA: Diagnosis not present

## 2012-07-26 DIAGNOSIS — I10 Essential (primary) hypertension: Secondary | ICD-10-CM

## 2012-07-26 DIAGNOSIS — E785 Hyperlipidemia, unspecified: Secondary | ICD-10-CM | POA: Diagnosis not present

## 2012-07-26 DIAGNOSIS — E039 Hypothyroidism, unspecified: Secondary | ICD-10-CM

## 2012-07-26 DIAGNOSIS — D649 Anemia, unspecified: Secondary | ICD-10-CM | POA: Diagnosis not present

## 2012-07-26 DIAGNOSIS — Z0000 Encounter for general adult medical examination without abnormal findings: Secondary | ICD-10-CM

## 2012-07-26 LAB — LIPID PANEL
Cholesterol: 187 mg/dL (ref 0–200)
HDL: 61 mg/dL (ref >39.00)
VLDL: 15.2 mg/dL (ref 0.0–40.0)

## 2012-07-26 LAB — HEPATIC FUNCTION PANEL
Bilirubin, Direct: 0.1 mg/dL (ref 0.0–0.3)
Total Bilirubin: 0.7 mg/dL (ref 0.3–1.2)

## 2012-07-26 LAB — POCT URINALYSIS DIPSTICK
Bilirubin, UA: NEGATIVE
Glucose, UA: NEGATIVE
Ketones, UA: NEGATIVE
Leukocytes, UA: NEGATIVE
Nitrite, UA: NEGATIVE

## 2012-07-26 LAB — BASIC METABOLIC PANEL
BUN: 19 mg/dL (ref 6–23)
Chloride: 101 mEq/L (ref 96–112)
Creatinine, Ser: 1 mg/dL (ref 0.4–1.2)
Glucose, Bld: 94 mg/dL (ref 70–99)

## 2012-07-26 LAB — CBC WITH DIFFERENTIAL/PLATELET
Basophils Absolute: 0 10*3/uL (ref 0.0–0.1)
Eosinophils Absolute: 0.2 10*3/uL (ref 0.0–0.7)
Eosinophils Relative: 3 % (ref 0.0–5.0)
Lymphs Abs: 2 10*3/uL (ref 0.7–4.0)
MCHC: 34.1 g/dL (ref 30.0–36.0)
MCV: 92.2 fl (ref 78.0–100.0)
Monocytes Absolute: 0.4 10*3/uL (ref 0.1–1.0)
Neutrophils Relative %: 51.4 % (ref 43.0–77.0)
Platelets: 205 10*3/uL (ref 150.0–400.0)
RDW: 13.2 % (ref 11.5–14.6)
WBC: 5.4 10*3/uL (ref 4.5–10.5)

## 2012-07-26 LAB — TSH: TSH: 1.94 u[IU]/mL (ref 0.35–5.50)

## 2012-08-06 ENCOUNTER — Ambulatory Visit (INDEPENDENT_AMBULATORY_CARE_PROVIDER_SITE_OTHER): Payer: Medicare Other | Admitting: Family Medicine

## 2012-08-06 ENCOUNTER — Encounter: Payer: Self-pay | Admitting: Family Medicine

## 2012-08-06 VITALS — BP 124/78 | HR 68 | Temp 98.2°F | Ht 62.0 in | Wt 143.0 lb

## 2012-08-06 DIAGNOSIS — N952 Postmenopausal atrophic vaginitis: Secondary | ICD-10-CM | POA: Diagnosis not present

## 2012-08-06 DIAGNOSIS — Z0000 Encounter for general adult medical examination without abnormal findings: Secondary | ICD-10-CM

## 2012-08-06 DIAGNOSIS — I10 Essential (primary) hypertension: Secondary | ICD-10-CM | POA: Diagnosis not present

## 2012-08-06 DIAGNOSIS — J309 Allergic rhinitis, unspecified: Secondary | ICD-10-CM | POA: Diagnosis not present

## 2012-08-06 DIAGNOSIS — R319 Hematuria, unspecified: Secondary | ICD-10-CM

## 2012-08-06 MED ORDER — ESTRADIOL 0.1 MG/GM VA CREA: TOPICAL_CREAM | VAGINAL | Status: DC

## 2012-08-06 MED ORDER — BISOPROLOL-HYDROCHLOROTHIAZIDE 5-6.25 MG PO TABS: ORAL_TABLET | ORAL | Status: DC

## 2012-08-06 NOTE — Patient Instructions (Signed)
Continue to take your blood pressure medication and aspirin tablet daily   hormonal cream twice weekly  Return in one year sooner if any problems

## 2012-08-06 NOTE — Progress Notes (Signed)
  Subjective:    Patient ID: Doris Barton, female    DOB: 1946/04/07, 67 y.o.   MRN: [id number]  HPI Doris Barton is a 67 year old married female nonsmoker who comes in today for general physical examination and Medicare wellness examination because of a history of underlying hypertension and postmenopausal vaginal dryness  She takes Ziac 5-6.25 daily for hypertension BP 120/78  She also takes an aspirin tablet and the hormonal cream twice weekly for vaginal dryness.  She keeps an EpiPen around just in case she has an allergic reaction  She gets routine eye care, dental care, and you mammography but does not do BSE monthly. Encouraged and showed how to do a breast exam monthly, colonoscopy x2 normal, tetanus 2009, shingles 2009, seasonal flu shot 2013.  Cognitive function normal he walks on a regular basis home health safety reviewed no issues identified, no guns in the house, she does have a health care power of attorney and living will   Review of Systems  Constitutional: Negative.   HENT: Negative.   Eyes: Negative.   Respiratory: Negative.   Cardiovascular: Negative.   Gastrointestinal: Negative.   Genitourinary: Negative.   Musculoskeletal: Negative.   Neurological: Negative.   Hematological: Negative.   Psychiatric/Behavioral: Negative.        Objective:   Physical Exam  Constitutional: She appears well-developed and well-nourished.  HENT:  Head: Normocephalic and atraumatic.  Right Ear: External ear normal.  Left Ear: External ear normal.  Nose: Nose normal.  Mouth/Throat: Oropharynx is clear and moist.  Eyes: EOM are normal. Pupils are equal, round, and reactive to light.  Neck: Normal range of motion. Neck supple. No thyromegaly present.  Cardiovascular: Normal rate, regular rhythm, normal heart sounds and intact distal pulses.  Exam reveals no gallop and no friction rub.   No murmur heard. Pulmonary/Chest: Effort normal and breath sounds normal.  Abdominal: Soft.  Bowel sounds are normal. She exhibits no distension and no mass. There is no tenderness. There is no rebound.  Genitourinary: Vagina normal and uterus normal. Guaiac negative stool. No vaginal discharge found.       Bilateral breast exam normal BSE was taught  Musculoskeletal: Normal range of motion.  Lymphadenopathy:    She has no cervical adenopathy.  Neurological: She is alert. She has normal reflexes. No cranial nerve deficit. She exhibits normal muscle tone. Coordination normal.  Skin: Skin is warm and dry.  Psychiatric: She has a normal mood and affect. Her behavior is normal. Judgment and thought content normal.          Assessment & Plan:  Healthy female  Hypertension continue Ziac one daily  Postmenopausal vaginal dryness continue hormonal cream twice weekly  Return in one year sooner if any problems

## 2012-08-16 DIAGNOSIS — J309 Allergic rhinitis, unspecified: Secondary | ICD-10-CM | POA: Diagnosis not present

## 2012-08-27 DIAGNOSIS — J309 Allergic rhinitis, unspecified: Secondary | ICD-10-CM | POA: Diagnosis not present

## 2012-08-30 DIAGNOSIS — J309 Allergic rhinitis, unspecified: Secondary | ICD-10-CM | POA: Diagnosis not present

## 2012-09-07 DIAGNOSIS — J309 Allergic rhinitis, unspecified: Secondary | ICD-10-CM | POA: Diagnosis not present

## 2012-09-14 DIAGNOSIS — J309 Allergic rhinitis, unspecified: Secondary | ICD-10-CM | POA: Diagnosis not present

## 2012-09-27 DIAGNOSIS — J309 Allergic rhinitis, unspecified: Secondary | ICD-10-CM | POA: Diagnosis not present

## 2012-10-05 DIAGNOSIS — J309 Allergic rhinitis, unspecified: Secondary | ICD-10-CM | POA: Diagnosis not present

## 2012-10-22 DIAGNOSIS — J309 Allergic rhinitis, unspecified: Secondary | ICD-10-CM | POA: Diagnosis not present

## 2012-10-26 DIAGNOSIS — Z1231 Encounter for screening mammogram for malignant neoplasm of breast: Secondary | ICD-10-CM | POA: Diagnosis not present

## 2012-11-01 DIAGNOSIS — N6009 Solitary cyst of unspecified breast: Secondary | ICD-10-CM | POA: Diagnosis not present

## 2012-11-01 DIAGNOSIS — N63 Unspecified lump in unspecified breast: Secondary | ICD-10-CM | POA: Diagnosis not present

## 2012-11-09 ENCOUNTER — Other Ambulatory Visit: Payer: Self-pay | Admitting: Radiology

## 2012-11-09 ENCOUNTER — Encounter: Payer: Self-pay | Admitting: Family Medicine

## 2012-11-09 DIAGNOSIS — J309 Allergic rhinitis, unspecified: Secondary | ICD-10-CM | POA: Diagnosis not present

## 2012-11-09 DIAGNOSIS — N63 Unspecified lump in unspecified breast: Secondary | ICD-10-CM | POA: Diagnosis not present

## 2012-11-09 DIAGNOSIS — N6009 Solitary cyst of unspecified breast: Secondary | ICD-10-CM | POA: Diagnosis not present

## 2012-11-15 DIAGNOSIS — J309 Allergic rhinitis, unspecified: Secondary | ICD-10-CM | POA: Diagnosis not present

## 2012-11-17 ENCOUNTER — Encounter: Payer: Self-pay | Admitting: Family Medicine

## 2012-11-29 DIAGNOSIS — J309 Allergic rhinitis, unspecified: Secondary | ICD-10-CM | POA: Diagnosis not present

## 2012-12-13 DIAGNOSIS — J309 Allergic rhinitis, unspecified: Secondary | ICD-10-CM | POA: Diagnosis not present

## 2012-12-21 ENCOUNTER — Other Ambulatory Visit (HOSPITAL_COMMUNITY): Payer: Self-pay | Admitting: Urology

## 2012-12-21 DIAGNOSIS — N281 Cyst of kidney, acquired: Secondary | ICD-10-CM

## 2012-12-29 DIAGNOSIS — J309 Allergic rhinitis, unspecified: Secondary | ICD-10-CM | POA: Diagnosis not present

## 2013-01-04 DIAGNOSIS — J309 Allergic rhinitis, unspecified: Secondary | ICD-10-CM | POA: Diagnosis not present

## 2013-01-05 ENCOUNTER — Other Ambulatory Visit: Payer: Self-pay

## 2013-01-05 DIAGNOSIS — D381 Neoplasm of uncertain behavior of trachea, bronchus and lung: Secondary | ICD-10-CM

## 2013-01-17 ENCOUNTER — Ambulatory Visit (HOSPITAL_COMMUNITY)
Admission: RE | Admit: 2013-01-17 | Discharge: 2013-01-17 | Disposition: A | Discharge status: Home / Self Care | Payer: Medicare Other | Source: Ambulatory Visit | Attending: Urology | Admitting: Urology

## 2013-01-17 DIAGNOSIS — R911 Solitary pulmonary nodule: Secondary | ICD-10-CM | POA: Insufficient documentation

## 2013-01-17 DIAGNOSIS — N281 Cyst of kidney, acquired: Secondary | ICD-10-CM | POA: Diagnosis not present

## 2013-01-17 DIAGNOSIS — D1803 Hemangioma of intra-abdominal structures: Secondary | ICD-10-CM | POA: Diagnosis not present

## 2013-01-17 LAB — CREATININE, SERUM
Creatinine, Ser: 0.87 mg/dL (ref 0.50–1.10)
GFR calc non Af Amer: 67 mL/min — ABNORMAL LOW (ref >90)

## 2013-01-17 MED ORDER — GADOBENATE DIMEGLUMINE 529 MG/ML IV SOLN
13.0000 mL | Freq: Once | INTRAVENOUS | Status: AC | PRN
  Administered 2013-01-17: 13 mL via INTRAVENOUS

## 2013-01-18 DIAGNOSIS — J309 Allergic rhinitis, unspecified: Secondary | ICD-10-CM | POA: Diagnosis not present

## 2013-01-18 DIAGNOSIS — N281 Cyst of kidney, acquired: Secondary | ICD-10-CM | POA: Diagnosis not present

## 2013-01-25 ENCOUNTER — Ambulatory Visit: Payer: Medicare Other | Admitting: Thoracic Surgery (Cardiothoracic Vascular Surgery)

## 2013-01-25 ENCOUNTER — Other Ambulatory Visit: Payer: Medicare Other

## 2013-02-08 DIAGNOSIS — J309 Allergic rhinitis, unspecified: Secondary | ICD-10-CM | POA: Diagnosis not present

## 2013-02-15 ENCOUNTER — Ambulatory Visit (INDEPENDENT_AMBULATORY_CARE_PROVIDER_SITE_OTHER): Payer: Medicare Other | Admitting: Thoracic Surgery (Cardiothoracic Vascular Surgery)

## 2013-02-15 ENCOUNTER — Ambulatory Visit
Admission: RE | Admit: 2013-02-15 | Discharge: 2013-02-15 | Disposition: A | Discharge status: Home / Self Care | Payer: Medicare Other | Source: Ambulatory Visit | Attending: Thoracic Surgery (Cardiothoracic Vascular Surgery) | Admitting: Thoracic Surgery (Cardiothoracic Vascular Surgery)

## 2013-02-15 ENCOUNTER — Other Ambulatory Visit: Payer: Self-pay | Admitting: *Deleted

## 2013-02-15 ENCOUNTER — Encounter: Payer: Self-pay | Admitting: Thoracic Surgery (Cardiothoracic Vascular Surgery)

## 2013-02-15 VITALS — BP 154/76 | HR 63 | Resp 16 | Ht 62.0 in | Wt 140.0 lb

## 2013-02-15 DIAGNOSIS — R911 Solitary pulmonary nodule: Secondary | ICD-10-CM

## 2013-02-15 DIAGNOSIS — J984 Other disorders of lung: Secondary | ICD-10-CM | POA: Diagnosis not present

## 2013-02-15 DIAGNOSIS — D381 Neoplasm of uncertain behavior of trachea, bronchus and lung: Secondary | ICD-10-CM

## 2013-02-15 DIAGNOSIS — J309 Allergic rhinitis, unspecified: Secondary | ICD-10-CM | POA: Diagnosis not present

## 2013-02-15 NOTE — Progress Notes (Signed)
HPI:  Doris Barton returns for a scheduled one year followup visit.  She is a 67 year old nonsmoker who was incidentally noted to have a left lower lobe nodule on a CT scan done in 2011 during workup of hematuria. This was a 9 mm groundglass nodule in the left lower lobe. We follow this closely for the first 2 years with CT scans about every 3 months. It showed no change over that time. She now returns for a 1 year followup (3 years from initial discovery).  In the interim since her last visit she's been doing well. She's not had any significant medical problems. She's not had any shortness of breath, cough, hemoptysis, chest pain or weight loss.  Past medical history Patient Active Problem List   Diagnosis Date Noted  . Lung nodule, solitary 02/15/2013  . HEMATURIA 12/15/2007  . VAGINITIS, ATROPHIC, POSTMENOPAUSAL 11/04/2007  . HYPERTENSION 04/12/2007  . DIVERTICULOSIS, COLON 04/12/2007      Current Outpatient Prescriptions  Medication Sig Dispense Refill  . aspirin 325 MG EC tablet Take 325 mg by mouth daily.        . bisoprolol-hydrochlorothiazide (ZIAC) 5-6.25 MG per tablet 1 by mouth every morning  100 tablet  3  . EPIPEN 2-PAK 0.3 MG/0.3ML DEVI Inject 0.3 mg into the skin once.       Marland Kitchen estradiol (ESTRACE VAGINAL) 0.1 MG/GM vaginal cream Apply small amounts twice weekly  42.5 g  11   No current facility-administered medications for this visit.    Physical Exam BP 154/76  Pulse 63  Resp 16  Ht 5\' 2"  (1.575 m)  Wt 140 lb (63.504 kg)  BMI 25.6 kg/m2  SpO2 97% 67 yo woman in NAD Well developed and well nourished  Diagnostic Tests: CT Chest 02/15/13 *RADIOLOGY REPORT*  Clinical Data: Follow-up evaluation of lung nodule.  CT CHEST WITHOUT CONTRAST  Technique: Multidetector CT imaging of the chest was performed  following the standard protocol without IV contrast.  Comparison: Chest CT 01/20/2012.  Findings:  Mediastinum: Heart size is normal. There is no significant   pericardial fluid, thickening or pericardial calcification. No  pathologically enlarged mediastinal or hilar lymph nodes. Please  note that accurate exclusion of hilar adenopathy is limited on  noncontrast CT scans. Esophagus is unremarkable in appearance.  Surgical clips anteriorly at the level of the thoracic inlet.  Lungs/Pleura: In the left lower lobe there is a nodule that is  increasingly conspicuous, particularly when compared with several  prior examinations. This nodule has mixed ground-glass attenuation  and solid components. The nodule overall measures 1.9 x 0.9 cm  (image 44 of series 4), with a central solid component measuring 6  mm (image 44 series 3). This is associated with some increasing  subtle surrounding architectural distortion at this time.  Previously noted 2 mm right lower lobe nodule is unchanged and  presumably benign. New 4 mm subpleural nodule in the anterior  aspect of the right upper lobe (image 14 of series 4) is  nonspecific.  Upper Abdomen: Well defined 1.6 cm low - intermediate attenuation  lesion in the right lobe of the liver is unchanged over numerous  prior examinations and favored to represent a small proteinaceous  cyst.  Musculoskeletal: There are no aggressive appearing lytic or blastic  lesions noted in the visualized portions of the skeleton.  IMPRESSION:  1. Interval increase in size of the mixed solid and subsolid  nodule in the left lower lobe which is now concerning in appearance  for a slowly growing adenocarcinoma. Consideration for biopsy or  further evaluation with PET CT may be warranted at this time.  2. Interval development of a new 4 mm subpleural nodule in the  anterior aspect of the right upper lobe which is highly  nonspecific. This may simply represent a subpleural lymph node,  however, attention on follow-up studies is recommended.  3. Additional incidental findings, as above.  Original Report Authenticated By: Trudie Reed, M.D.   Impression:  67 year old nonsmoker with an incidentally noted left lower lobe nodule. Over the past year this lesion appears to have increased very minimally in size in may also have increased in terms of the solid component. This was felt by radiology to be concerning for a slow-growing adenocarcinoma.  I discussed the results with Doris Barton and talked about our options.   One option would be to continue to follow this radiographically. I would wish to do a PET/CT to make sure that this has not become highly active before I would be comfortable with that option.  A second option would be to attempt to biopsy this lesion either bronchoscopically or with a CT-guided needle biopsy. Unfortunately both of those would be subject to a high rate of sampling error and I don't think that it would be reliable enough to rule out possibility that this is a low grade adenocarcinoma.  Finally, the most definitive option would be to proceed with a left VATS and wedge resection of the lesion for excisional biopsy and likely definitive treatment. This was the option I favored. However, she is reluctant to undergo surgery at this time. 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 They're in the process of selling their house and she is very reluctant to undergo surgery given her present circumstances.  After a lengthy discussion we decided to proceed with a PET/CT to evaluate the lesion. If this is not hypermetabolic or has very low-grade activity we will plan to scan her again in 6 months. If there is significant metabolic activity I will encourage her to proceed with surgical resection.  I will plan to see her back after the PET CT with her to discuss the results and determine how to proceed.

## 2013-02-18 ENCOUNTER — Encounter (HOSPITAL_COMMUNITY)
Admission: RE | Admit: 2013-02-18 | Discharge: 2013-02-18 | Disposition: A | Discharge status: Home / Self Care | Payer: Medicare Other | Source: Ambulatory Visit | Attending: Thoracic Surgery (Cardiothoracic Vascular Surgery) | Admitting: Thoracic Surgery (Cardiothoracic Vascular Surgery)

## 2013-02-18 DIAGNOSIS — C3490 Malignant neoplasm of unspecified part of unspecified bronchus or lung: Secondary | ICD-10-CM | POA: Insufficient documentation

## 2013-02-18 DIAGNOSIS — R911 Solitary pulmonary nodule: Secondary | ICD-10-CM

## 2013-02-18 LAB — GLUCOSE, CAPILLARY: Glucose-Capillary: 96 mg/dL (ref 70–99)

## 2013-02-18 MED ORDER — FLUDEOXYGLUCOSE F - 18 (FDG) INJECTION
19.8000 | Freq: Once | INTRAVENOUS | Status: AC | PRN
  Administered 2013-02-18: 19.8 via INTRAVENOUS

## 2013-02-22 ENCOUNTER — Encounter: Payer: Self-pay | Admitting: *Deleted

## 2013-02-22 ENCOUNTER — Ambulatory Visit (INDEPENDENT_AMBULATORY_CARE_PROVIDER_SITE_OTHER): Payer: Medicare Other | Admitting: Thoracic Surgery (Cardiothoracic Vascular Surgery)

## 2013-02-22 ENCOUNTER — Encounter: Payer: Self-pay | Admitting: Thoracic Surgery (Cardiothoracic Vascular Surgery)

## 2013-02-22 VITALS — BP 160/83 | HR 70 | Resp 20 | Ht 62.0 in | Wt 140.0 lb

## 2013-02-22 DIAGNOSIS — J984 Other disorders of lung: Secondary | ICD-10-CM | POA: Diagnosis not present

## 2013-02-22 DIAGNOSIS — J309 Allergic rhinitis, unspecified: Secondary | ICD-10-CM | POA: Diagnosis not present

## 2013-02-22 DIAGNOSIS — R911 Solitary pulmonary nodule: Secondary | ICD-10-CM

## 2013-02-22 NOTE — Progress Notes (Signed)
Patient ID: Doris Barton, female   DOB: 1945/08/17, 67 y.o.   MRN: [id number]  [name] returns today to discuss the results of her PET/CT.  She is a 11 year old woman (nonsmoker) who was incidentally noted to have a left lower lobe nodule on a CT scan done in 2011 during workup of hematuria. This was a 9 mm groundglass nodule in the left lower lobe. We follow this closely for the first 2 years with CT scans about every 3 months. It showed no change over that time. I recently saw her for a 3 year followup and the CT showed that the lesion had increased in size in the interim.  Based on that advised her that the nodule should come out. She has some family issues that would make it very difficult for her to have surgery at this time. Her husband is suffered a stroke and she is the sole caretaker. She also is planning to take care of her grandchildren for the next month and the family doesn't really have any other option further care.  We did a PET/CT to assess whether there was any evidence of hypermetabolism in the nodule that would suggest a malignant transformation.  *RADIOLOGY REPORT*  Clinical Data: Initial treatment strategy for pulmonary nodule.  NUCLEAR MEDICINE PET SKULL BASE TO THIGH  Fasting Blood Glucose: 96  Technique: 19.8 mCi F-18 FDG was injected intravenously. CT data  was obtained and used for attenuation correction and anatomic  localization only. (This was not acquired as a diagnostic CT  examination.) Additional exam technical data entered on  technologist worksheet.  Comparison: CT 02/15/2013, PET CT 03/19/2010, CT 01/20/2012  Findings:  Neck: No hypermetabolic nodes in the neck.  Chest: Within the left lower lobe, there is an irregular pulmonary  nodule measuring 16 x 9 mm which has slowly increased in size over  time. This lesion continues to not demonstrate any metabolic  activity. No hypermetabolic mediastinal lymph nodes.  Abdomen / Pelvis:No abnormal  hypermetabolic activity within the  solid organs. No evidence of abdominal or pelvic hypermetabolic  nodes.  Skeleton:No focal hypermetabolic activity to suggest skeletal  metastasis.  IMPRESSION:  No metabolic activity associated with the irregular left lower lobe  nodule. That the nodule is increasing in size over time likely  warrants percutaneous biopsy to exclude a low grade adenocarcinoma.  Original Report Authenticated By: Genevive Bi, M.D.  \  Impression  I discussed the results of the PET CT with Mrs. Naron. The left lower lobe nodule is not hypermetabolic. I think she can safely wait 6-8 weeks to have the nodule removed without any undue risk. This nodule has only increased by a few mm over course of 3 years. There is no evidence of this as an aggressive lesion.  At her request we will schedule her for a followup visit in early September and discuss scheduling surgery at that time.

## 2013-02-28 DIAGNOSIS — J309 Allergic rhinitis, unspecified: Secondary | ICD-10-CM | POA: Diagnosis not present

## 2013-03-14 DIAGNOSIS — J309 Allergic rhinitis, unspecified: Secondary | ICD-10-CM | POA: Diagnosis not present

## 2013-03-28 DIAGNOSIS — J309 Allergic rhinitis, unspecified: Secondary | ICD-10-CM | POA: Diagnosis not present

## 2013-03-29 DIAGNOSIS — J309 Allergic rhinitis, unspecified: Secondary | ICD-10-CM | POA: Diagnosis not present

## 2013-04-05 DIAGNOSIS — J309 Allergic rhinitis, unspecified: Secondary | ICD-10-CM | POA: Diagnosis not present

## 2013-04-05 DIAGNOSIS — Z23 Encounter for immunization: Secondary | ICD-10-CM | POA: Diagnosis not present

## 2013-04-12 ENCOUNTER — Ambulatory Visit (INDEPENDENT_AMBULATORY_CARE_PROVIDER_SITE_OTHER): Payer: Medicare Other | Admitting: Thoracic Surgery (Cardiothoracic Vascular Surgery)

## 2013-04-12 ENCOUNTER — Other Ambulatory Visit: Payer: Self-pay | Admitting: *Deleted

## 2013-04-12 ENCOUNTER — Encounter: Payer: Self-pay | Admitting: Thoracic Surgery (Cardiothoracic Vascular Surgery)

## 2013-04-12 VITALS — BP 157/71 | HR 60 | Resp 16 | Ht 62.0 in | Wt 135.0 lb

## 2013-04-12 DIAGNOSIS — R911 Solitary pulmonary nodule: Secondary | ICD-10-CM

## 2013-04-12 DIAGNOSIS — D381 Neoplasm of uncertain behavior of trachea, bronchus and lung: Secondary | ICD-10-CM | POA: Diagnosis not present

## 2013-04-12 DIAGNOSIS — J309 Allergic rhinitis, unspecified: Secondary | ICD-10-CM | POA: Diagnosis not present

## 2013-04-12 NOTE — Progress Notes (Signed)
HPI:  Doris Barton returns today to discuss removal of a left lower lobe nodule. She is a 67 year old nonsmoker who is found to have a left lower lobe nodule on CT scan about 3 years ago. She was being worked up for hematuria at the time. A CT showed a 1 cm nodule in the left lower lobe. We followed that for 2 years with serial CT scans and showed no signs of change. We did 3 year followup CT and it showed signs of growth. A PET CT showed the lesion was not hypermetabolic, but given the growth there still is concerned this could be a low-grade adenocarcinoma in situ. I recommended that we go ahead and remove that lesion. She agreed to that but had some issues as needed to address and wished to wait until now to have the procedure done.  She's been feeling well. Her weight has been stable. She's not having chest pain or shortness of breath.  Past medical history Hypertension Diverticulosis Hematuria Vaginitis Solitary left lower lobe lung nodule  Current Outpatient Prescriptions  Medication Sig Dispense Refill  . aspirin 325 MG EC tablet Take 325 mg by mouth daily.        . bisoprolol-hydrochlorothiazide (ZIAC) 5-6.25 MG per tablet 1 by mouth every morning  100 tablet  3  . EPIPEN 2-PAK 0.3 MG/0.3ML DEVI Inject 0.3 mg into the skin once.       Marland Kitchen estradiol (ESTRACE VAGINAL) 0.1 MG/GM vaginal cream Apply small amounts twice weekly  42.5 g  11   No current facility-administered medications for this visit.    Physical Exam BP 157/71  Pulse 60  Resp 16  Ht 5\' 2"  (1.575 m)  Wt 135 lb (61.236 kg)  BMI 24.69 kg/m2  SpO5 35% 67 year old woman in no acute distress Neurologic alert and oriented x3 with no focal deficits No cervical or subclavicular adenopathy Cardiac regular rate and rhythm, no murmur Lungs clear with equal breath sounds Abdomen soft nontender Extremities without clubbing cyanosis or edema  Diagnostic Tests: CT chest 02/15/2013 CT CHEST WITHOUT CONTRAST  Technique:  Multidetector CT imaging of the chest was performed  following the standard protocol without IV contrast.  Comparison: Chest CT 01/20/2012.  Findings:  Mediastinum: Heart size is normal. There is no significant  pericardial fluid, thickening or pericardial calcification. No  pathologically enlarged mediastinal or hilar lymph nodes. Please  note that accurate exclusion of hilar adenopathy is limited on  noncontrast CT scans. Esophagus is unremarkable in appearance.  Surgical clips anteriorly at the level of the thoracic inlet.  Lungs/Pleura: In the left lower lobe there is a nodule that is  increasingly conspicuous, particularly when compared with several  prior examinations. This nodule has mixed ground-glass attenuation  and solid components. The nodule overall measures 1.9 x 0.9 cm  (image 44 of series 4), with a central solid component measuring 6  mm (image 44 series 3). This is associated with some increasing  subtle surrounding architectural distortion at this time.  Previously noted 2 mm right lower lobe nodule is unchanged and  presumably benign. New 4 mm subpleural nodule in the anterior  aspect of the right upper lobe (image 14 of series 4) is  nonspecific.  Upper Abdomen: Well defined 1.6 cm low - intermediate attenuation  lesion in the right lobe of the liver is unchanged over numerous  prior examinations and favored to represent a small proteinaceous  cyst.  Musculoskeletal: There are no aggressive appearing lytic or blastic  lesions  noted in the visualized portions of the skeleton.  IMPRESSION:  1. Interval increase in size of the mixed solid and subsolid  nodule in the left lower lobe which is now concerning in appearance  for a slowly growing adenocarcinoma. Consideration for biopsy or  further evaluation with PET CT may be warranted at this time.  2. Interval development of a new 4 mm subpleural nodule in the  anterior aspect of the right upper lobe which is highly   nonspecific. This may simply represent a subpleural lymph node,  however, attention on follow-up studies is recommended.  3. Additional incidental findings, as above.  Original Report Authenticated By: Trudie Reed, M.D.  PET/CT 02/18/2013  NUCLEAR MEDICINE PET SKULL BASE TO THIGH  Fasting Blood Glucose: 96  Technique: 19.8 mCi F-18 FDG was injected intravenously. CT data  was obtained and used for attenuation correction and anatomic  localization only. (This was not acquired as a diagnostic CT  examination.) Additional exam technical data entered on  technologist worksheet.  Comparison: CT 02/15/2013, PET CT 03/19/2010, CT 01/20/2012  Findings:  Neck: No hypermetabolic nodes in the neck.  Chest: Within the left lower lobe, there is an irregular pulmonary  nodule measuring 16 x 9 mm which has slowly increased in size over  time. This lesion continues to not demonstrate any metabolic  activity. No hypermetabolic mediastinal lymph nodes.  Abdomen / Pelvis:No abnormal hypermetabolic activity within the  solid organs. No evidence of abdominal or pelvic hypermetabolic  nodes.  Skeleton:No focal hypermetabolic activity to suggest skeletal  metastasis.  IMPRESSION:  No metabolic activity associated with the irregular left lower lobe  nodule. That the nodule is increasing in size over time likely  warrants percutaneous biopsy to exclude a low grade adenocarcinoma.  Original Report Authenticated By: Genevive Bi, M.D.  Impression: 67 year old nonsmoker with a incidentally discovered lung nodule. After 3 years this lesion has shown growth. As negative by PET CT, which is not surprising given its very slow rate of growth. It is still concerning however that this could represent a low-grade adenocarcinoma or adenocarcinoma in situ. Given that possibility I think it is best that we resect this nodule.  I discussed with Doris Barton the indications, risks, benefits, and alternatives. I  informed her of the general nature of the procedure, the need for general anesthesia, and the incisions to be used. I discussed the expected hospital stay, overall recovery and short and long term outcomes. She understands the risks include but are not limited to death, stroke, MI, DVT/PE, bleeding, possible need for transfusion, infections,  prolonged air leak, and/or other unforeseeable complications. She accepts these risks and agree to proceed.  Plan: Left VATS, wedge resection, possible segmentectomy for left lower lobe nodule on Monday September 22.

## 2013-04-21 ENCOUNTER — Encounter (HOSPITAL_COMMUNITY)
Admission: RE | Admit: 2013-04-21 | Discharge: 2013-04-21 | Disposition: A | Discharge status: Home / Self Care | Payer: Medicare Other | Source: Ambulatory Visit | Attending: Thoracic Surgery (Cardiothoracic Vascular Surgery) | Admitting: Thoracic Surgery (Cardiothoracic Vascular Surgery)

## 2013-04-21 ENCOUNTER — Encounter (HOSPITAL_COMMUNITY): Payer: Self-pay

## 2013-04-21 VITALS — BP 134/73 | HR 58 | Temp 98.3°F | Resp 18 | Ht 62.0 in | Wt 140.2 lb

## 2013-04-21 DIAGNOSIS — R911 Solitary pulmonary nodule: Secondary | ICD-10-CM

## 2013-04-21 DIAGNOSIS — Z01812 Encounter for preprocedural laboratory examination: Secondary | ICD-10-CM | POA: Insufficient documentation

## 2013-04-21 DIAGNOSIS — Z01818 Encounter for other preprocedural examination: Secondary | ICD-10-CM | POA: Diagnosis not present

## 2013-04-21 DIAGNOSIS — Z01810 Encounter for preprocedural cardiovascular examination: Secondary | ICD-10-CM | POA: Insufficient documentation

## 2013-04-21 HISTORY — DX: Other specified postprocedural states: R11.2

## 2013-04-21 HISTORY — DX: Unspecified osteoarthritis, unspecified site: M19.90

## 2013-04-21 HISTORY — DX: Hematuria, unspecified: R31.9

## 2013-04-21 HISTORY — DX: Headache: R51

## 2013-04-21 HISTORY — DX: Other specified postprocedural states: Z98.890

## 2013-04-21 HISTORY — DX: Unspecified asthma, uncomplicated: J45.909

## 2013-04-21 HISTORY — DX: Essential (primary) hypertension: I10

## 2013-04-21 LAB — CBC
Platelets: 198 10*3/uL (ref 150–400)
RBC: 4.54 MIL/uL (ref 3.87–5.11)
WBC: 6.9 10*3/uL (ref 4.0–10.5)

## 2013-04-21 LAB — SURGICAL PCR SCREEN: MRSA, PCR: NEGATIVE

## 2013-04-21 LAB — URINALYSIS, ROUTINE W REFLEX MICROSCOPIC
Glucose, UA: NEGATIVE mg/dL
Leukocytes, UA: NEGATIVE
Nitrite: NEGATIVE
Protein, ur: NEGATIVE mg/dL
pH: 6.5 (ref 5.0–8.0)

## 2013-04-21 LAB — BLOOD GAS, ARTERIAL
Acid-Base Excess: 4.3 mmol/L — ABNORMAL HIGH (ref 0.0–2.0)
Drawn by: 181601
FIO2: 0.21 %
pCO2 arterial: 41 mmHg (ref 35.0–45.0)
pO2, Arterial: 83.2 mmHg (ref 80.0–100.0)

## 2013-04-21 LAB — COMPREHENSIVE METABOLIC PANEL
ALT: 20 U/L (ref 0–35)
AST: 21 U/L (ref 0–37)
CO2: 24 mEq/L (ref 19–32)
Chloride: 100 mEq/L (ref 96–112)
GFR calc non Af Amer: 84 mL/min — ABNORMAL LOW (ref >90)
Sodium: 137 mEq/L (ref 135–145)
Total Bilirubin: 0.5 mg/dL (ref 0.3–1.2)

## 2013-04-21 LAB — URINE MICROSCOPIC-ADD ON

## 2013-04-21 LAB — TYPE AND SCREEN: ABO/RH(D): O POS

## 2013-04-21 LAB — APTT: aPTT: 29 seconds (ref 24–37)

## 2013-04-21 NOTE — Pre-Procedure Instructions (Signed)
Doris Barton  04/21/2013   Your procedure is scheduled on:  Sept 22 [address]30  Report to [facility] at 0530 AM.  Call this number if you have problems the morning of surgery: [phone]   Remember:   Do not eat food or drink liquids after midnight.   Take these medicines the morning of surgery with A SIP OF WATER: Ziac  Take aspirin as directed by your Dr.  Stop taking Aleve, Ibuprofen, Herbal medications, Fish oil, BC's, and Goody's.   Do not wear jewelry, make-up or nail polish.  Do not wear lotions, powders, or perfumes. You may wear deodorant.  Do not shave 48 hours prior to surgery. Men may shave face and neck.  Do not bring valuables to the hospital.  [facility] is not responsible                   for any belongings or valuables.  Contacts, dentures or bridgework may not be worn into surgery.  Leave suitcase in the car. After surgery it may be brought to your room.  For patients admitted to the hospital, checkout time is 11:00 AM the day of  discharge.   Patients discharged the day of surgery will not be allowed to drive  home.    Special Instructions: Shower using CHG 2 nights before surgery and the night before surgery.  If you shower the day of surgery use CHG.  Use special wash - you have one bottle of CHG for all showers.  You should use approximately 1/3 of the bottle for each shower.   Please read over the following fact sheets that you were given: Pain Booklet, Coughing and Deep Breathing, Blood Transfusion Information, MRSA Information and Surgical Site Infection Prevention

## 2013-04-21 NOTE — Progress Notes (Signed)
Denies seeing a cardiologist  PCP Dr Kelle Darting  Denies having a stress test, card cath, or echo

## 2013-04-22 NOTE — Progress Notes (Signed)
Anesthesia Chart Review:  Patient is a 67 year old female scheduled for left VATS, wedge resection, possible segmentectomy for evaluation of LLL nodule on 04/25/13 by Dr. Dorris Fetch.  History includes non-smoker, HTN, diverticulosis, hematuria ~ '11, asthma, arthritis, headaches.  PCP is Dr. Kelle Darting.  EKG on 04/21/13 showed SB, septal infarct (age undetermined). I think her EKG appears stable when compared to her EKG with Dr. Tawanna Cooler on 08/06/12 and from 09/23/10.  Preoperative labs noted.  She is for a CXR on the day of surgery.  Anticipate that she can proceed as planned.  Velna Ochs [facility]/Anesthesiology Phone [phone] 04/22/2013 10:07 AM

## 2013-04-24 MED ORDER — DEXTROSE 5 % IV SOLN
1.5000 g | INTRAVENOUS | Status: AC
  Administered 2013-04-25: 1.5 g via INTRAVENOUS
  Filled 2013-04-24 (×2): qty 1.5

## 2013-04-25 ENCOUNTER — Encounter (HOSPITAL_COMMUNITY): Payer: Self-pay | Admitting: Vascular Surgery

## 2013-04-25 ENCOUNTER — Inpatient Hospital Stay (HOSPITAL_COMMUNITY)
Admission: RE | Admit: 2013-04-25 | Discharge: 2013-04-28 | DRG: 165 | Disposition: A | Discharge status: Home / Self Care | Payer: Medicare Other | Source: Ambulatory Visit | Attending: Thoracic Surgery (Cardiothoracic Vascular Surgery) | Admitting: Thoracic Surgery (Cardiothoracic Vascular Surgery)

## 2013-04-25 ENCOUNTER — Inpatient Hospital Stay (HOSPITAL_COMMUNITY): Payer: Medicare Other

## 2013-04-25 ENCOUNTER — Ambulatory Visit (HOSPITAL_COMMUNITY): Payer: Medicare Other

## 2013-04-25 ENCOUNTER — Encounter (HOSPITAL_COMMUNITY): Payer: Self-pay | Admitting: *Deleted

## 2013-04-25 ENCOUNTER — Ambulatory Visit (HOSPITAL_COMMUNITY): Payer: Medicare Other | Admitting: Anesthesiology

## 2013-04-25 ENCOUNTER — Encounter (HOSPITAL_COMMUNITY)
Admission: RE | Disposition: A | Discharge status: Home / Self Care | Payer: Self-pay | Source: Ambulatory Visit | Attending: Thoracic Surgery (Cardiothoracic Vascular Surgery)

## 2013-04-25 DIAGNOSIS — Z79899 Other long term (current) drug therapy: Secondary | ICD-10-CM | POA: Diagnosis not present

## 2013-04-25 DIAGNOSIS — I10 Essential (primary) hypertension: Secondary | ICD-10-CM | POA: Diagnosis not present

## 2013-04-25 DIAGNOSIS — C3430 Malignant neoplasm of lower lobe, unspecified bronchus or lung: Secondary | ICD-10-CM | POA: Diagnosis not present

## 2013-04-25 DIAGNOSIS — J45909 Unspecified asthma, uncomplicated: Secondary | ICD-10-CM | POA: Diagnosis not present

## 2013-04-25 DIAGNOSIS — I498 Other specified cardiac arrhythmias: Secondary | ICD-10-CM | POA: Diagnosis not present

## 2013-04-25 DIAGNOSIS — R911 Solitary pulmonary nodule: Secondary | ICD-10-CM | POA: Diagnosis present

## 2013-04-25 DIAGNOSIS — Z7982 Long term (current) use of aspirin: Secondary | ICD-10-CM | POA: Diagnosis not present

## 2013-04-25 DIAGNOSIS — R197 Diarrhea, unspecified: Secondary | ICD-10-CM | POA: Diagnosis not present

## 2013-04-25 DIAGNOSIS — Z4682 Encounter for fitting and adjustment of non-vascular catheter: Secondary | ICD-10-CM | POA: Diagnosis not present

## 2013-04-25 DIAGNOSIS — J9819 Other pulmonary collapse: Secondary | ICD-10-CM | POA: Diagnosis not present

## 2013-04-25 DIAGNOSIS — D381 Neoplasm of uncertain behavior of trachea, bronchus and lung: Secondary | ICD-10-CM | POA: Diagnosis not present

## 2013-04-25 DIAGNOSIS — J95811 Postprocedural pneumothorax: Secondary | ICD-10-CM | POA: Diagnosis not present

## 2013-04-25 DIAGNOSIS — Z01818 Encounter for other preprocedural examination: Secondary | ICD-10-CM | POA: Diagnosis not present

## 2013-04-25 DIAGNOSIS — R0989 Other specified symptoms and signs involving the circulatory and respiratory systems: Secondary | ICD-10-CM | POA: Diagnosis not present

## 2013-04-25 HISTORY — DX: Malignant neoplasm of unspecified part of unspecified bronchus or lung: C34.90

## 2013-04-25 HISTORY — PX: VIDEO ASSISTED THORACOSCOPY (VATS)/WEDGE RESECTION: SHX6174

## 2013-04-25 SURGERY — VIDEO ASSISTED THORACOSCOPY (VATS)/WEDGE RESECTION
Anesthesia: General | Site: Chest | Laterality: Left | Wound class: Clean Contaminated

## 2013-04-25 MED ORDER — ONDANSETRON HCL 4 MG/2ML IJ SOLN
INTRAMUSCULAR | Status: DC | PRN
  Administered 2013-04-25: 4 mg via INTRAVENOUS

## 2013-04-25 MED ORDER — OXYCODONE HCL 5 MG PO TABS: 5.0000 mg | ORAL_TABLET | Freq: Once | ORAL | Status: DC | PRN

## 2013-04-25 MED ORDER — DIPHENHYDRAMINE HCL 50 MG/ML IJ SOLN: 12.5000 mg | Freq: Four times a day (QID) | INTRAMUSCULAR | Status: DC | PRN

## 2013-04-25 MED ORDER — ACETAMINOPHEN 160 MG/5ML PO SOLN
1000.0000 mg | Freq: Four times a day (QID) | ORAL | Status: AC
  Administered 2013-04-25 (×2): 1000 mg via ORAL
  Filled 2013-04-25 (×6): qty 40

## 2013-04-25 MED ORDER — NALOXONE HCL 0.4 MG/ML IJ SOLN: 0.4000 mg | INTRAMUSCULAR | Status: DC | PRN

## 2013-04-25 MED ORDER — MIDAZOLAM HCL 2 MG/2ML IJ SOLN
INTRAMUSCULAR | Status: AC
  Filled 2013-04-25: qty 2

## 2013-04-25 MED ORDER — FENTANYL 10 MCG/ML IV SOLN
INTRAVENOUS | Status: DC
  Administered 2013-04-25: 11:00:00 via INTRAVENOUS
  Administered 2013-04-25: 100 ug via INTRAVENOUS
  Administered 2013-04-26: 20 ug via INTRAVENOUS
  Administered 2013-04-26: 130 ug via INTRAVENOUS
  Administered 2013-04-26: 110 ug via INTRAVENOUS
  Administered 2013-04-26: 80 ug via INTRAVENOUS
  Administered 2013-04-26: 100 ug via INTRAVENOUS
  Administered 2013-04-27: 130 ug via INTRAVENOUS
  Administered 2013-04-27: 40 ug via INTRAVENOUS
  Filled 2013-04-25 (×6): qty 50

## 2013-04-25 MED ORDER — OXYCODONE-ACETAMINOPHEN 5-325 MG PO TABS: 1.0000 | ORAL_TABLET | ORAL | Status: DC | PRN

## 2013-04-25 MED ORDER — PROPOFOL 10 MG/ML IV BOLUS
INTRAVENOUS | Status: DC | PRN
  Administered 2013-04-25: 200 mg via INTRAVENOUS

## 2013-04-25 MED ORDER — DIPHENHYDRAMINE HCL 12.5 MG/5ML PO ELIX
12.5000 mg | ORAL_SOLUTION | Freq: Four times a day (QID) | ORAL | Status: DC | PRN
  Filled 2013-04-25: qty 5

## 2013-04-25 MED ORDER — FENTANYL CITRATE 0.05 MG/ML IJ SOLN
INTRAMUSCULAR | Status: AC
  Filled 2013-04-25: qty 2

## 2013-04-25 MED ORDER — ARTIFICIAL TEARS OP OINT
TOPICAL_OINTMENT | OPHTHALMIC | Status: DC | PRN
  Administered 2013-04-25: 1 via OPHTHALMIC

## 2013-04-25 MED ORDER — OXYCODONE HCL 5 MG PO TABS: 5.0000 mg | ORAL_TABLET | ORAL | Status: DC | PRN

## 2013-04-25 MED ORDER — MIDAZOLAM HCL 2 MG/2ML IJ SOLN
1.0000 mg | INTRAMUSCULAR | Status: DC | PRN
  Administered 2013-04-25: 2 mg via INTRAVENOUS

## 2013-04-25 MED ORDER — ASPIRIN EC 325 MG PO TBEC: 325.0000 mg | DELAYED_RELEASE_TABLET | Freq: Every day | ORAL | Status: DC

## 2013-04-25 MED ORDER — BISOPROLOL-HYDROCHLOROTHIAZIDE 5-6.25 MG PO TABS
1.0000 | ORAL_TABLET | Freq: Every day | ORAL | Status: DC
  Administered 2013-04-26 – 2013-04-28 (×3): 1 via ORAL
  Filled 2013-04-25 (×3): qty 1

## 2013-04-25 MED ORDER — TRAMADOL HCL 50 MG PO TABS
50.0000 mg | ORAL_TABLET | Freq: Four times a day (QID) | ORAL | Status: DC | PRN
  Administered 2013-04-27 – 2013-04-28 (×2): 50 mg via ORAL
  Filled 2013-04-25 (×2): qty 1

## 2013-04-25 MED ORDER — NEOSTIGMINE METHYLSULFATE 1 MG/ML IJ SOLN
INTRAMUSCULAR | Status: DC | PRN
  Administered 2013-04-25: 3 mg via INTRAVENOUS

## 2013-04-25 MED ORDER — ROCURONIUM BROMIDE 100 MG/10ML IV SOLN
INTRAVENOUS | Status: DC | PRN
  Administered 2013-04-25 (×2): 5 mg via INTRAVENOUS
  Administered 2013-04-25: 50 mg via INTRAVENOUS

## 2013-04-25 MED ORDER — FENTANYL CITRATE 0.05 MG/ML IJ SOLN
50.0000 ug | Freq: Once | INTRAMUSCULAR | Status: AC
  Administered 2013-04-25: 150 ug via INTRAVENOUS
  Administered 2013-04-25: 100 ug via INTRAVENOUS
  Administered 2013-04-25: 50 ug via INTRAVENOUS

## 2013-04-25 MED ORDER — ONDANSETRON HCL 4 MG/2ML IJ SOLN
4.0000 mg | Freq: Four times a day (QID) | INTRAMUSCULAR | Status: DC | PRN
  Filled 2013-04-25 (×2): qty 2

## 2013-04-25 MED ORDER — ONDANSETRON HCL 4 MG/2ML IJ SOLN
4.0000 mg | Freq: Four times a day (QID) | INTRAMUSCULAR | Status: DC | PRN
  Administered 2013-04-26 – 2013-04-27 (×4): 4 mg via INTRAVENOUS
  Filled 2013-04-25 (×2): qty 2

## 2013-04-25 MED ORDER — OXYCODONE HCL 5 MG/5ML PO SOLN: 5.0000 mg | Freq: Once | ORAL | Status: DC | PRN

## 2013-04-25 MED ORDER — DEXTROSE-NACL 5-0.9 % IV SOLN
INTRAVENOUS | Status: DC
  Administered 2013-04-25 – 2013-04-26 (×2): via INTRAVENOUS
  Administered 2013-04-27: 1000 mL via INTRAVENOUS

## 2013-04-25 MED ORDER — SODIUM CHLORIDE 0.9 % IJ SOLN: 9.0000 mL | INTRAMUSCULAR | Status: DC | PRN

## 2013-04-25 MED ORDER — 0.9 % SODIUM CHLORIDE (POUR BTL) OPTIME
TOPICAL | Status: DC | PRN
  Administered 2013-04-25: 2000 mL

## 2013-04-25 MED ORDER — HYDROMORPHONE HCL PF 1 MG/ML IJ SOLN
INTRAMUSCULAR | Status: AC
  Administered 2013-04-25: 0.5 mg via INTRAVENOUS
  Filled 2013-04-25: qty 1

## 2013-04-25 MED ORDER — ASPIRIN EC 325 MG PO TBEC
325.0000 mg | DELAYED_RELEASE_TABLET | Freq: Every day | ORAL | Status: DC
  Administered 2013-04-26 – 2013-04-28 (×3): 325 mg via ORAL
  Filled 2013-04-25 (×3): qty 1

## 2013-04-25 MED ORDER — ACETAMINOPHEN 500 MG PO TABS
1000.0000 mg | ORAL_TABLET | Freq: Four times a day (QID) | ORAL | Status: AC
  Administered 2013-04-26 (×2): 1000 mg via ORAL
  Filled 2013-04-25 (×4): qty 2

## 2013-04-25 MED ORDER — ESTRADIOL 0.1 MG/GM VA CREA
1.0000 | TOPICAL_CREAM | VAGINAL | Status: DC
  Administered 2013-04-28: 1 via VAGINAL
  Filled 2013-04-25: qty 42.5

## 2013-04-25 MED ORDER — SENNOSIDES-DOCUSATE SODIUM 8.6-50 MG PO TABS
1.0000 | ORAL_TABLET | Freq: Every evening | ORAL | Status: DC | PRN
  Filled 2013-04-25: qty 1

## 2013-04-25 MED ORDER — LACTATED RINGERS IV SOLN
INTRAVENOUS | Status: DC | PRN
  Administered 2013-04-25 (×2): via INTRAVENOUS

## 2013-04-25 MED ORDER — BISACODYL 5 MG PO TBEC
10.0000 mg | DELAYED_RELEASE_TABLET | Freq: Every day | ORAL | Status: DC
  Administered 2013-04-25 – 2013-04-26 (×2): 10 mg via ORAL
  Filled 2013-04-25 (×2): qty 2

## 2013-04-25 MED ORDER — PHENYLEPHRINE HCL 10 MG/ML IJ SOLN
INTRAMUSCULAR | Status: DC | PRN
  Administered 2013-04-25 (×2): 80 ug via INTRAVENOUS
  Administered 2013-04-25: 40 ug via INTRAVENOUS
  Administered 2013-04-25: 80 ug via INTRAVENOUS
  Administered 2013-04-25: 20 ug via INTRAVENOUS
  Administered 2013-04-25: 40 ug via INTRAVENOUS

## 2013-04-25 MED ORDER — HYDROMORPHONE HCL PF 1 MG/ML IJ SOLN
0.2500 mg | INTRAMUSCULAR | Status: DC | PRN
  Administered 2013-04-25 (×2): 0.5 mg via INTRAVENOUS

## 2013-04-25 MED ORDER — GLYCOPYRROLATE 0.2 MG/ML IJ SOLN
INTRAMUSCULAR | Status: DC | PRN
  Administered 2013-04-25: 0.4 mg via INTRAVENOUS
  Administered 2013-04-25: 0.2 mg via INTRAVENOUS

## 2013-04-25 MED ORDER — PROMETHAZINE HCL 25 MG/ML IJ SOLN: 6.2500 mg | INTRAMUSCULAR | Status: DC | PRN

## 2013-04-25 MED ORDER — POTASSIUM CHLORIDE 10 MEQ/50ML IV SOLN
10.0000 meq | Freq: Every day | INTRAVENOUS | Status: DC | PRN
  Administered 2013-04-26 – 2013-04-27 (×7): 10 meq via INTRAVENOUS
  Filled 2013-04-25 (×3): qty 50

## 2013-04-25 SURGICAL SUPPLY — 74 items
ADH SKN CLS APL DERMABOND .7 (GAUZE/BANDAGES/DRESSINGS) ×2
APL SKNCLS STERI-STRIP NONHPOA (GAUZE/BANDAGES/DRESSINGS) ×2
BAG SPEC RTRVL LRG 6X4 10 (ENDOMECHANICALS)
BENZOIN TINCTURE PRP APPL 2/3 (GAUZE/BANDAGES/DRESSINGS) ×3 IMPLANT
CANISTER SUCTION 2500CC (MISCELLANEOUS) ×4 IMPLANT
CATH KIT ON Q 5IN SLV (PAIN MANAGEMENT) IMPLANT
CATH THORACIC 28FR (CATHETERS) IMPLANT
CATH THORACIC 36FR (CATHETERS) IMPLANT
CATH THORACIC 36FR RT ANG (CATHETERS) IMPLANT
CLIP TI MEDIUM 6 (CLIP) ×3 IMPLANT
CONT SPEC 4OZ CLIKSEAL STRL BL (MISCELLANEOUS) ×8 IMPLANT
DERMABOND ADVANCED (GAUZE/BANDAGES/DRESSINGS) ×1
DERMABOND ADVANCED .7 DNX12 (GAUZE/BANDAGES/DRESSINGS) ×1 IMPLANT
DRAIN CHANNEL 28F RND 3/8 FF (WOUND CARE) ×2 IMPLANT
DRAIN CHANNEL 32F RND 10.7 FF (WOUND CARE) IMPLANT
DRAPE LAPAROSCOPIC ABDOMINAL (DRAPES) ×3 IMPLANT
DRAPE WARM FLUID 44X44 (DRAPE) ×3 IMPLANT
ELECT REM PT RETURN 9FT ADLT (ELECTROSURGICAL) ×3
ELECTRODE REM PT RTRN 9FT ADLT (ELECTROSURGICAL) ×2 IMPLANT
GLOVE BIO SURGEON STRL SZ7.5 (GLOVE) ×2 IMPLANT
GLOVE SURG SIGNA 7.5 PF LTX (GLOVE) ×6 IMPLANT
GLOVE SURG SS PI 7.0 STRL IVOR (GLOVE) ×6 IMPLANT
GOWN PREVENTION PLUS XLARGE (GOWN DISPOSABLE) ×6 IMPLANT
GOWN STRL NON-REIN LRG LVL3 (GOWN DISPOSABLE) ×2 IMPLANT
GOWN STRL REIN XL XLG (GOWN DISPOSABLE) ×6 IMPLANT
HANDLE STAPLE ENDO GIA SHORT (STAPLE) ×1
KIT BASIN OR (CUSTOM PROCEDURE TRAY) ×3 IMPLANT
KIT ROOM TURNOVER OR (KITS) ×3 IMPLANT
KIT SUCTION CATH 14FR (SUCTIONS) ×1 IMPLANT
NS IRRIG 1000ML POUR BTL (IV SOLUTION) ×6 IMPLANT
PACK CHEST (CUSTOM PROCEDURE TRAY) ×3 IMPLANT
PAD ARMBOARD 7.5X6 YLW CONV (MISCELLANEOUS) ×6 IMPLANT
POUCH ENDO CATCH II 15MM (MISCELLANEOUS) IMPLANT
POUCH SPECIMEN RETRIEVAL 10MM (ENDOMECHANICALS) IMPLANT
RELOAD EGIA 45 MED/THCK PURPLE (STAPLE) ×4 IMPLANT
RELOAD EGIA 60 MED/THCK PURPLE (STAPLE) ×6 IMPLANT
RELOAD STAPLE 60 MED/THCK ART (STAPLE) IMPLANT
SEALANT PROGEL (MISCELLANEOUS) IMPLANT
SEALANT SURG COSEAL 4ML (VASCULAR PRODUCTS) IMPLANT
SEALANT SURG COSEAL 8ML (VASCULAR PRODUCTS) IMPLANT
SOLUTION ANTI FOG 6CC (MISCELLANEOUS) ×3 IMPLANT
SPECIMEN JAR MEDIUM (MISCELLANEOUS) ×3 IMPLANT
SPONGE GAUZE 4X4 12PLY (GAUZE/BANDAGES/DRESSINGS) ×3 IMPLANT
STAPLER ENDO GIA 12 SHRT THIN (STAPLE) IMPLANT
STAPLER ENDO GIA 12MM SHORT (STAPLE) ×2 IMPLANT
SUT PROLENE 4 0 RB 1 (SUTURE)
SUT PROLENE 4-0 RB1 .5 CRCL 36 (SUTURE) IMPLANT
SUT SILK  1 MH (SUTURE) ×2
SUT SILK 1 MH (SUTURE) ×3 IMPLANT
SUT SILK 2 0SH CR/8 30 (SUTURE) IMPLANT
SUT SILK 3 0SH CR/8 30 (SUTURE) IMPLANT
SUT VIC AB 0 CTX 27 (SUTURE) IMPLANT
SUT VIC AB 1 CTX 27 (SUTURE) ×2 IMPLANT
SUT VIC AB 2-0 CT1 27 (SUTURE) ×3
SUT VIC AB 2-0 CT1 TAPERPNT 27 (SUTURE) ×1 IMPLANT
SUT VIC AB 2-0 CTX 36 (SUTURE) IMPLANT
SUT VIC AB 3-0 MH 27 (SUTURE) IMPLANT
SUT VIC AB 3-0 SH 27 (SUTURE)
SUT VIC AB 3-0 SH 27X BRD (SUTURE) IMPLANT
SUT VIC AB 3-0 X1 27 (SUTURE) ×4 IMPLANT
SUT VICRYL 0 UR6 27IN ABS (SUTURE) ×4 IMPLANT
SUT VICRYL 2 TP 1 (SUTURE) IMPLANT
SWAB COLLECTION DEVICE MRSA (MISCELLANEOUS) IMPLANT
SYSTEM SAHARA CHEST DRAIN ATS (WOUND CARE) ×3 IMPLANT
TAPE CLOTH SURG 4X10 WHT LF (GAUZE/BANDAGES/DRESSINGS) ×2 IMPLANT
TIP APPLICATOR SPRAY EXTEND 16 (VASCULAR PRODUCTS) IMPLANT
TOWEL OR 17X24 6PK STRL BLUE (TOWEL DISPOSABLE) ×3 IMPLANT
TOWEL OR 17X26 10 PK STRL BLUE (TOWEL DISPOSABLE) ×6 IMPLANT
TRAP SPECIMEN MUCOUS 40CC (MISCELLANEOUS) IMPLANT
TRAY FOLEY CATH 14FRSI W/METER (CATHETERS) ×3 IMPLANT
TRAY FOLEY CATH 16FRSI W/METER (SET/KITS/TRAYS/PACK) ×2 IMPLANT
TUBE ANAEROBIC SPECIMEN COL (MISCELLANEOUS) IMPLANT
TUNNELER SHEATH ON-Q 11GX8 DSP (PAIN MANAGEMENT) IMPLANT
WATER STERILE IRR 1000ML POUR (IV SOLUTION) ×4 IMPLANT

## 2013-04-25 NOTE — Transfer of Care (Signed)
Immediate Anesthesia Transfer of Care Note  Patient: Doris Barton  Procedure(s) Performed: Procedure(s): VIDEO ASSISTED THORACOSCOPY, LEFT LOWER LOBE WEDGE RESECTION AND LYMPH NODE SAMPLING (Left)  Patient Location: PACU  Anesthesia Type:General  Level of Consciousness: awake, alert , oriented and patient cooperative  Airway & Oxygen Therapy: Patient Spontanous Breathing and Patient connected to nasal cannula oxygen  Post-op Assessment: Report given to PACU RN, Post -op Vital signs reviewed and stable and Patient moving all extremities  Post vital signs: Reviewed and stable  Complications: No apparent anesthesia complications

## 2013-04-25 NOTE — Progress Notes (Signed)
Pt arrived from PACU, VSS, will continue to monitor. 

## 2013-04-25 NOTE — Brief Op Note (Addendum)
      [address]       [place] [address]             [phone]     04/25/2013  9:30 AM  PATIENT:  Oval Linsey  67 y.o. female  PRE-OPERATIVE DIAGNOSIS:  LLL LUNG NODULE  POST-OPERATIVE DIAGNOSIS:  Left Lowerb Lobe Lung Nodule  PROCEDURE:  Procedure(s): VIDEO ASSISTED THORACOSCOPY, LEFT LOWER LOBE WEDGE RESECTION AND LYMPH NODE SAMPLING  SURGEON:  Surgeon(s): Loreli Slot, MD  PHYSICIAN ASSISTANT: Gershon Crane PA-C  ANESTHESIA:   general  SPECIMEN:  Source of Specimen:  LLL wedge resection and LN samplings # 5 and #10  DISPOSITION OF SPECIMEN:  Pathology  DRAINS: 1 Chest Tube(s) in the left hemithorax   PATIENT CONDITION:  PACU - hemodynamically stable.  PRE-OPERATIVE WEIGHT: 63kg  FROZEN: atypical glandular proliferation suspicious for adenocarcinoma with lepidic growth, negative margin

## 2013-04-25 NOTE — Progress Notes (Signed)
Utilization review completed.  

## 2013-04-25 NOTE — Anesthesia Procedure Notes (Signed)
Procedure Name: Intubation Date/Time: 04/25/2013 7:50 AM Performed by: Jerilee Hoh Pre-anesthesia Checklist: Patient identified, Emergency Drugs available, Suction available and Patient being monitored Patient Re-evaluated:Patient Re-evaluated prior to inductionOxygen Delivery Method: Circle system utilized Preoxygenation: Pre-oxygenation with 100% oxygen Intubation Type: IV induction Ventilation: Mask ventilation without difficulty Laryngoscope Size: Mac and 3 Grade View: Grade I Tube type: Oral Endobronchial tube: Left, Double lumen EBT, EBT position confirmed by fiberoptic bronchoscope and EBT position confirmed by auscultation and 37 Fr Number of attempts: 1 Airway Equipment and Method: Stylet and Fiberoptic brochoscope Placement Confirmation: ETT inserted through vocal cords under direct vision,  positive ETCO2 and breath sounds checked- equal and bilateral Tube secured with: Tape Dental Injury: Injury to lip  Comments: Small laceration noted to right lower lip.  Ointment applied. Teeth as preop.

## 2013-04-25 NOTE — Anesthesia Postprocedure Evaluation (Signed)
  Anesthesia Post-op Note  Patient: Doris Barton  Procedure(s) Performed: Procedure(s): VIDEO ASSISTED THORACOSCOPY, LEFT LOWER LOBE WEDGE RESECTION AND LYMPH NODE SAMPLING (Left)  Patient Location: PACU  Anesthesia Type:General  Level of Consciousness: awake  Airway and Oxygen Therapy: Patient Spontanous Breathing  Post-op Pain: mild  Post-op Assessment: Post-op Vital signs reviewed, Patient's Cardiovascular Status Stable, Respiratory Function Stable, Patent Airway, No signs of Nausea or vomiting and Pain level controlled  Post-op Vital Signs: stable  Complications: No apparent anesthesia complications

## 2013-04-25 NOTE — H&P (View-Only) (Signed)
HPI:  Mrs. Schedler returns today to discuss removal of a left lower lobe nodule. She is a 67-year-old nonsmoker who is found to have a left lower lobe nodule on CT scan about 3 years ago. She was being worked up for hematuria at the time. A CT showed a 1 cm nodule in the left lower lobe. We followed that for 2 years with serial CT scans and showed no signs of change. We did 3 year followup CT and it showed signs of growth. A PET CT showed the lesion was not hypermetabolic, but given the growth there still is concerned this could be a low-grade adenocarcinoma in situ. I recommended that we go ahead and remove that lesion. She agreed to that but had some issues as needed to address and wished to wait until now to have the procedure done.  She's been feeling well. Her weight has been stable. She's not having chest pain or shortness of breath.  Past medical history Hypertension Diverticulosis Hematuria Vaginitis Solitary left lower lobe lung nodule  Current Outpatient Prescriptions  Medication Sig Dispense Refill  . aspirin 325 MG EC tablet Take 325 mg by mouth daily.        . bisoprolol-hydrochlorothiazide (ZIAC) 5-6.25 MG per tablet 1 by mouth every morning  100 tablet  3  . EPIPEN 2-PAK 0.3 MG/0.3ML DEVI Inject 0.3 mg into the skin once.       . estradiol (ESTRACE VAGINAL) 0.1 MG/GM vaginal cream Apply small amounts twice weekly  42.5 g  11   No current facility-administered medications for this visit.    Physical Exam BP 157/71  Pulse 60  Resp 16  Ht 5' 2" (1.575 m)  Wt 135 lb (61.236 kg)  BMI 24.69 kg/m2  SpO2 99% 67-year-old woman in no acute distress Neurologic alert and oriented x3 with no focal deficits No cervical or subclavicular adenopathy Cardiac regular rate and rhythm, no murmur Lungs clear with equal breath sounds Abdomen soft nontender Extremities without clubbing cyanosis or edema  Diagnostic Tests: CT chest 02/15/2013 CT CHEST WITHOUT CONTRAST  Technique:  Multidetector CT imaging of the chest was performed  following the standard protocol without IV contrast.  Comparison: Chest CT 01/20/2012.  Findings:  Mediastinum: Heart size is normal. There is no significant  pericardial fluid, thickening or pericardial calcification. No  pathologically enlarged mediastinal or hilar lymph nodes. Please  note that accurate exclusion of hilar adenopathy is limited on  noncontrast CT scans. Esophagus is unremarkable in appearance.  Surgical clips anteriorly at the level of the thoracic inlet.  Lungs/Pleura: In the left lower lobe there is a nodule that is  increasingly conspicuous, particularly when compared with several  prior examinations. This nodule has mixed ground-glass attenuation  and solid components. The nodule overall measures 1.9 x 0.9 cm  (image 44 of series 4), with a central solid component measuring 6  mm (image 44 series 3). This is associated with some increasing  subtle surrounding architectural distortion at this time.  Previously noted 2 mm right lower lobe nodule is unchanged and  presumably benign. New 4 mm subpleural nodule in the anterior  aspect of the right upper lobe (image 14 of series 4) is  nonspecific.  Upper Abdomen: Well defined 1.6 cm low - intermediate attenuation  lesion in the right lobe of the liver is unchanged over numerous  prior examinations and favored to represent a small proteinaceous  cyst.  Musculoskeletal: There are no aggressive appearing lytic or blastic  lesions   noted in the visualized portions of the skeleton.  IMPRESSION:  1. Interval increase in size of the mixed solid and subsolid  nodule in the left lower lobe which is now concerning in appearance  for a slowly growing adenocarcinoma. Consideration for biopsy or  further evaluation with PET CT may be warranted at this time.  2. Interval development of a new 4 mm subpleural nodule in the  anterior aspect of the right upper lobe which is highly   nonspecific. This may simply represent a subpleural lymph node,  however, attention on follow-up studies is recommended.  3. Additional incidental findings, as above.  Original Report Authenticated By: Daniel Entrikin, M.D.  PET/CT 02/18/2013  NUCLEAR MEDICINE PET SKULL BASE TO THIGH  Fasting Blood Glucose: 96  Technique: 19.8 mCi F-18 FDG was injected intravenously. CT data  was obtained and used for attenuation correction and anatomic  localization only. (This was not acquired as a diagnostic CT  examination.) Additional exam technical data entered on  technologist worksheet.  Comparison: CT 02/15/2013, PET CT 03/19/2010, CT 01/20/2012  Findings:  Neck: No hypermetabolic nodes in the neck.  Chest: Within the left lower lobe, there is an irregular pulmonary  nodule measuring 16 x 9 mm which has slowly increased in size over  time. This lesion continues to not demonstrate any metabolic  activity. No hypermetabolic mediastinal lymph nodes.  Abdomen / Pelvis:No abnormal hypermetabolic activity within the  solid organs. No evidence of abdominal or pelvic hypermetabolic  nodes.  Skeleton:No focal hypermetabolic activity to suggest skeletal  metastasis.  IMPRESSION:  No metabolic activity associated with the irregular left lower lobe  nodule. That the nodule is increasing in size over time likely  warrants percutaneous biopsy to exclude a low grade adenocarcinoma.  Original Report Authenticated By: Stewart Edmunds, M.D.  Impression: 67-year-old nonsmoker with a incidentally discovered lung nodule. After 3 years this lesion has shown growth. As negative by PET CT, which is not surprising given its very slow rate of growth. It is still concerning however that this could represent a low-grade adenocarcinoma or adenocarcinoma in situ. Given that possibility I think it is best that we resect this nodule.  I discussed with Mrs. Weisse the indications, risks, benefits, and alternatives. I  informed her of the general nature of the procedure, the need for general anesthesia, and the incisions to be used. I discussed the expected hospital stay, overall recovery and short and long term outcomes. She understands the risks include but are not limited to death, stroke, MI, DVT/PE, bleeding, possible need for transfusion, infections,  prolonged air leak, and/or other unforeseeable complications. She accepts these risks and agree to proceed.  Plan: Left VATS, wedge resection, possible segmentectomy for left lower lobe nodule on Monday September 22.   

## 2013-04-25 NOTE — Interval H&P Note (Signed)
History and Physical Interval Note:  04/25/2013 7:24 AM  Doris Barton  has presented today for surgery, with the diagnosis of LLL LUNG NODULE  The various methods of treatment have been discussed with the patient and family. After consideration of risks, benefits and other options for treatment, the patient has consented to  Procedure(s): VIDEO ASSISTED THORACOSCOPY (VATS)/WEDGE RESECTION (Left) SEGMENTECTOMY (Left) as a surgical intervention .  The patient's history has been reviewed, patient examined, no change in status, stable for surgery.  I have reviewed the patient's chart and labs.  Questions were answered to the patient's satisfaction.     Doris Barton C

## 2013-04-25 NOTE — Preoperative (Signed)
Beta Blockers   Reason not to administer Beta Blockers:Not Applicable 

## 2013-04-25 NOTE — Anesthesia Preprocedure Evaluation (Addendum)
Anesthesia Evaluation  Patient identified by MRN, date of birth, ID band Patient awake    Reviewed: Allergy & Precautions, H&P , NPO status , Patient's Chart, lab work & pertinent test results  History of Anesthesia Complications (+) PONV  Airway Mallampati: I TM Distance: >3 FB Neck ROM: Full    Dental   Pulmonary asthma ,  Lung nodule breath sounds clear to auscultation        Cardiovascular hypertension, Rhythm:Regular Rate:Normal     Neuro/Psych  Headaches,    GI/Hepatic   Endo/Other    Renal/GU      Musculoskeletal   Abdominal   Peds  Hematology   Anesthesia Other Findings   Reproductive/Obstetrics                          Anesthesia Physical Anesthesia Plan  ASA: III  Anesthesia Plan: General   Post-op Pain Management:    Induction: Intravenous  Airway Management Planned: Oral ETT  Additional Equipment: CVP, Arterial line and Ultrasound Guidance Line Placement  Intra-op Plan:   Post-operative Plan: Extubation in OR  Informed Consent: I have reviewed the patients History and Physical, chart, labs and discussed the procedure including the risks, benefits and alternatives for the proposed anesthesia with the patient or authorized representative who has indicated his/her understanding and acceptance.   Dental advisory given  Plan Discussed with: Anesthesiologist and Surgeon  Anesthesia Plan Comments:         Anesthesia Quick Evaluation

## 2013-04-26 ENCOUNTER — Inpatient Hospital Stay (HOSPITAL_COMMUNITY): Payer: Medicare Other

## 2013-04-26 ENCOUNTER — Encounter (HOSPITAL_COMMUNITY): Payer: Self-pay | Admitting: Thoracic Surgery (Cardiothoracic Vascular Surgery)

## 2013-04-26 DIAGNOSIS — Z4682 Encounter for fitting and adjustment of non-vascular catheter: Secondary | ICD-10-CM | POA: Diagnosis not present

## 2013-04-26 DIAGNOSIS — J9819 Other pulmonary collapse: Secondary | ICD-10-CM | POA: Diagnosis not present

## 2013-04-26 LAB — CBC
HCT: 33.4 % — ABNORMAL LOW (ref 36.0–46.0)
Hemoglobin: 11.6 g/dL — ABNORMAL LOW (ref 12.0–15.0)
MCH: 31.1 pg (ref 26.0–34.0)
MCHC: 34.7 g/dL (ref 30.0–36.0)
MCV: 89.5 fL (ref 78.0–100.0)
Platelets: 153 10*3/uL (ref 150–400)
RBC: 3.73 MIL/uL — ABNORMAL LOW (ref 3.87–5.11)
RDW: 13.8 % (ref 11.5–15.5)

## 2013-04-26 LAB — BLOOD GAS, ARTERIAL
Acid-Base Excess: 2.3 mmol/L — ABNORMAL HIGH (ref 0.0–2.0)
Bicarbonate: 26.7 mEq/L — ABNORMAL HIGH (ref 20.0–24.0)
O2 Content: 2 L/min
O2 Saturation: 95.3 %
Patient temperature: 98.6
TCO2: 28.1 mmol/L (ref 0–100)
pCO2 arterial: 44.3 mmHg (ref 35.0–45.0)
pO2, Arterial: 73 mmHg — ABNORMAL LOW (ref 80.0–100.0)

## 2013-04-26 LAB — BASIC METABOLIC PANEL
CO2: 25 mEq/L (ref 19–32)
Calcium: 7.8 mg/dL — ABNORMAL LOW (ref 8.4–10.5)
Creatinine, Ser: 0.69 mg/dL (ref 0.50–1.10)
GFR calc non Af Amer: 88 mL/min — ABNORMAL LOW (ref >90)
Glucose, Bld: 139 mg/dL — ABNORMAL HIGH (ref 70–99)
Sodium: 142 mEq/L (ref 135–145)

## 2013-04-26 MED ORDER — POTASSIUM CHLORIDE CRYS ER 20 MEQ PO TBCR
40.0000 meq | EXTENDED_RELEASE_TABLET | Freq: Once | ORAL | Status: AC
  Administered 2013-04-26: 40 meq via ORAL
  Filled 2013-04-26: qty 2

## 2013-04-26 MED ORDER — OXYCODONE HCL 5 MG PO TABS: 5.0000 mg | ORAL_TABLET | ORAL | Status: DC | PRN

## 2013-04-26 NOTE — Progress Notes (Addendum)
[facility] DAILY ICU PROGRESS NOTE                   [address]            [place] [address]          [phone]   1 Day Post-Op Procedure(s) (LRB): VIDEO ASSISTED THORACOSCOPY, LEFT LOWER LOBE WEDGE RESECTION AND LYMPH NODE SAMPLING (Left)  Total Length of Stay:  LOS: 1 day   Subjective:  feels well, using PCA but pain well controlled, only mild nausea   Objective: Vital signs in last 24 hours: Temp:  [96 F (35.6 C)-98.6 F (37 C)] 98.5 F (36.9 C) (09/23 0353) Pulse Rate:  [41-63] 56 (09/23 0713) Cardiac Rhythm:  [-] Sinus bradycardia (09/23 0354) Resp:  [8-23] 22 (09/23 0713) BP: (109-136)/(45-57) 110/45 mmHg (09/23 0713) SpO2:  [95 %-100 %] 96 % (09/23 0713) Arterial Line BP: (125-167)/(52-81) 165/81 mmHg (09/23 0713) Weight:  [140 lb (63.504 kg)] 140 lb (63.504 kg) (09/22 1210)  Filed Weights   04/25/13 1210  Weight: 140 lb (63.504 kg)    Weight change:    Hemodynamic parameters for last 24 hours:    Intake/Output from previous day: 09/22 0701 - 09/23 0700 In: 3320 [P.O.:120; I.V.:3200] Out: 1800 [Urine:1600; Drains:150; Blood:50]  Intake/Output this shift:    Current Meds: Scheduled Meds: . aspirin EC  325 mg Oral Daily  . bisacodyl  10 mg Oral Daily  . bisoprolol-hydrochlorothiazide  1 tablet Oral Daily  . [START ON 04/28/2013] estradiol  1 Applicatorful Vaginal 2 times weekly  . fentaNYL   Intravenous Q4H   Continuous Infusions: . dextrose 5 % and 0.9% NaCl 100 mL/hr at 04/26/13 0036   PRN Meds:.diphenhydrAMINE, diphenhydrAMINE, naloxone, ondansetron (ZOFRAN) IV, ondansetron (ZOFRAN) IV, oxyCODONE, oxyCODONE-acetaminophen, oxyCODONE-acetaminophen, potassium chloride, senna-docusate, sodium chloride, traMADol  General appearance: alert, cooperative and no distress Heart: regular rate and rhythm and brady Lungs: sounds clear but takes shallow breaths Abdomen: benign Extremities: warm  Wound: dressings intact  Lab  Results: CBC: Recent Labs  04/26/13 0520  WBC 8.2  HGB 11.6*  HCT 33.4*  PLT 153   BMET:  Recent Labs  04/26/13 0520  NA 142  K 3.2*  CL 108  CO2 25  GLUCOSE 139*  BUN 12  CREATININE 0.69  CALCIUM 7.8*    PT/INR: No results found for this basename: LABPROT, INR,  in the last 72 hours Radiology: Dg Chest 2 View  04/25/2013   CLINICAL DATA:  Preop surgery.  EXAM: CHEST  2 VIEW  COMPARISON:  Chest CT 02/15/2013  FINDINGS: Known left lung nodule not well seen by radiography. Normal heart size. No infiltrate, edema, effusion, or pneumothorax.  IMPRESSION: No active cardiopulmonary disease.   Electronically Signed   By: Tiburcio Pea   On: 04/25/2013 06:12   Dg Chest Portable 1 View  04/25/2013   CLINICAL DATA:  66 year old female status post chest tube placement.  EXAM: PORTABLE CHEST - 1 VIEW  COMPARISON:  0607 hr the same day and earlier.  FINDINGS: Portable AP semi upright view at a 1023 hr. Left subclavian central line and left chest tube in place. Tip at the SVC level just above the carinal. No pneumothorax identified.  Lower lung volumes. Confluent retrocardiac opacity. Midline surgical clips projecting over the trachea are stable. Stable cardiac size and mediastinal contours. Aside from crowding, the right lung appears clear.  IMPRESSION: 1.  Left subclavian central line placed, tip at the SVC level.  2. Left chest tube in place. No pneumothorax identified.  3. Lower lung volumes. Confluent left lower lobe atelectasis or consolidation.   Electronically Signed   By: Augusto Gamble M.D.   On: 04/25/2013 11:54     Assessment/Plan: S/P Procedure(s) (LRB): VIDEO ASSISTED THORACOSCOPY, LEFT LOWER LOBE WEDGE RESECTION AND LYMPH NODE SAMPLING (Left)  1 doing well 2 No air leak, will go to H2O seal  3 d/c foley and art line 4 bradycardia- will hold off restarting Ziac for now 5 replace K+ , good UO- decrease IVF rate 6 cont PCA 7 push pulm toilet/rehab- routine   GOLD,WAYNE  E 04/26/2013 7:59 AM  Patient seen and examined. Agree with above. Will leave foley today for comfort- dc in AM No air leak- CT to water seal- can probably come out tomorrow

## 2013-04-26 NOTE — Op Note (Signed)
NAME:  RONDELL, FRICK NO.:  [id number]  MEDICAL RECORD NO.:  [id number]  LOCATION:  3S02C                        FACILITY:  [facility]  PHYSICIAN:  Salvatore Decent. Dorris Fetch, M.D.DATE OF BIRTH:  1945/10/24  DATE OF PROCEDURE:  04/25/2013 DATE OF DISCHARGE:                              OPERATIVE REPORT   PREOPERATIVE DIAGNOSIS:  Left lower lobe nodule.  POSTOPERATIVE DIAGNOSIS:  Atypical glandular proliferation, possible adenocarcinoma with lepidic spread.  PROCEDURE:  Left video-assisted thoracoscopic wedge resection with lymph node sampling.  SURGEON:  Salvatore Decent. Dorris Fetch, M.D.  ASSISTANT:  Rowe Clack, PA-C  ANESTHESIA:  General.  FINDINGS:  Approximately 1 x 1.5 cm mass in the basilar portion of the left lower lobe.  Frozen section revealed atypical glandular proliferation, possible adenocarcinoma with lepidic growth. Margins Clear. Enlarged, but otherwise benign-appearing level 5 lymph nodes.  CLINICAL NOTE:  Ms. Festa is a 67 year old woman who had been followed for years after having had a small lung nodule incidentally noted on a CT scan.  For 2 years, this showed no signs of growth, but over the third year had grown slightly.  PET/CT showed that the lesion was not hypermetabolic, but given the increase in size, she was advised to have the lesion resected.  The indications, risks, benefits, and alternatives have been discussed in detail with the patient.  She understood and accepted the risks and agreed to proceed.  She wished to delay the surgery until now in order to deal with family issues.  She now presents for resection of the nodule with a plan for a wedge resection if possible and a segmentectomy if necessary to achieve adequate margins.  CLINICAL NOTE:  Ms. Rothman was brought to the preoperative holding area on April 25, 2013.  There Anesthesia placed a central venous line and an arterial blood pressure monitoring line.  She was  taken to the operating room, anesthetized, and intubated.  PAS hose were placed for DVT prophylaxis.  Foley catheter was placed.  Intravenous antibiotics were administered.  She was placed in a right lateral decubitus position and the left chest was prepped and draped in usual sterile fashion.  Single lung ventilation of the right lung was carried out and was tolerated well throughout the procedure.  An incision was made in the seventh intercostal space in the anterior axillary line. A port was inserted.  Thoracoscope was placed into the chest. Initial inspection revealed no abnormalities of the visceral or parietal pleura. There was no pleural effusion.  A small utility incision was made in the anterior axillary line. All of the work was done through this small incision. No rib spreading was performed.  The inferior pulmonary ligament was divided with electrocautery up to the level of the inferior pulmonary vein.  No lymph nodes were encountered.  The left lower lobe was grasped and palpated and the nodule was palpated in the lower lateral aspect of the left lower lobe. A wedge resection was performed with sequential firings of an endoscopic GIA stapler using a purple cartridge.  Grossly, there was a good margin around the lesion.  The specimen was sent for frozen section. While awaiting those results, the pleural reflection  was divided at the hilum medially.  A level 10 node was identified and taken as a separate specimen.  This was a benign-appearing node.  Next, the aortopulmonary window was inspected.  The pleura overlying it was incised.  There was an enlarged, but otherwise benign-appearing lymph node in the AP window. While dissecting this out, 2 additional nodes came into view, these were all sent as a single specimen labeled level 5 lymph nodes.  At this point, the frozen section returned showing atypical glandular proliferation concerning for adenocarcinoma with lepidic growth.   The margins were clear and there was no evidence of invasive cancer.  No additional resection was needed.  A 28-French chest tube was placed through the original port incision and secured with a #1 silk suture.  The lung was reinflated.  The small utility incision was closed with a #1 Vicryl fascial suture followed by 2-0 Vicryl subcutaneous suture and a 3-0 Vicryl subcuticular suture. The patient was extubated in the operating room, taken to the postanesthetic care unit in good condition.  All sponge, needle, and instrument counts were correct at the end of procedure.     Salvatore Decent Dorris Fetch, M.D.     SCH/MEDQ  D:  04/25/2013  T:  04/26/2013  Job:  [id number]

## 2013-04-27 ENCOUNTER — Inpatient Hospital Stay (HOSPITAL_COMMUNITY): Payer: Medicare Other

## 2013-04-27 DIAGNOSIS — R0989 Other specified symptoms and signs involving the circulatory and respiratory systems: Secondary | ICD-10-CM | POA: Diagnosis not present

## 2013-04-27 DIAGNOSIS — J95811 Postprocedural pneumothorax: Secondary | ICD-10-CM | POA: Diagnosis not present

## 2013-04-27 DIAGNOSIS — Z4682 Encounter for fitting and adjustment of non-vascular catheter: Secondary | ICD-10-CM | POA: Diagnosis not present

## 2013-04-27 LAB — COMPREHENSIVE METABOLIC PANEL
ALT: 12 U/L (ref 0–35)
CO2: 27 mEq/L (ref 19–32)
Calcium: 8.2 mg/dL — ABNORMAL LOW (ref 8.4–10.5)
Creatinine, Ser: 0.61 mg/dL (ref 0.50–1.10)
GFR calc Af Amer: >90 mL/min (ref >90)
Glucose, Bld: 99 mg/dL (ref 70–99)
Sodium: 141 mEq/L (ref 135–145)
Total Bilirubin: 0.5 mg/dL (ref 0.3–1.2)
Total Protein: 5.4 g/dL — ABNORMAL LOW (ref 6.0–8.3)

## 2013-04-27 LAB — CBC
Hemoglobin: 11.6 g/dL — ABNORMAL LOW (ref 12.0–15.0)
MCH: 31.2 pg (ref 26.0–34.0)
MCHC: 34.1 g/dL (ref 30.0–36.0)

## 2013-04-27 MED ORDER — INFLUENZA VAC SPLIT QUAD 0.5 ML IM SUSP: 0.5000 mL | INTRAMUSCULAR | Status: DC | PRN

## 2013-04-27 NOTE — Progress Notes (Signed)
[facility] DAILY ICU PROGRESS NOTE                   [address]            [place] [address]          [phone]   2 Days Post-Op Procedure(s) (LRB): VIDEO ASSISTED THORACOSCOPY, LEFT LOWER LOBE WEDGE RESECTION AND LYMPH NODE SAMPLING (Left)  Total Length of Stay:  LOS: 2 days   Subjective: conts to do very well Only 50 cc recorded chest tube drainage Using less pain meds  Having some diarrhea  Objective: Vital signs in last 24 hours: Temp:  [97.9 F (36.6 C)-98.8 F (37.1 C)] 98.8 F (37.1 C) (09/24 0327) Pulse Rate:  [51-68] 64 (09/24 0327) Cardiac Rhythm:  [-] Sinus bradycardia (09/24 0327) Resp:  [13-17] 14 (09/24 0327) BP: (121-150)/(50-73) 150/66 mmHg (09/24 0327) SpO2:  [95 %-100 %] 95 % (09/24 0327)  Filed Weights   04/25/13 1210  Weight: 140 lb (63.504 kg)    Weight change:    Hemodynamic parameters for last 24 hours:    Intake/Output from previous day: 09/23 0701 - 09/24 0700 In: 1103.3 [I.V.:953.3; IV Piggyback:150] Out: 1275 [Urine:1100; Drains:175]  Intake/Output this shift:    Current Meds: Scheduled Meds: . aspirin EC  325 mg Oral Daily  . bisacodyl  10 mg Oral Daily  . bisoprolol-hydrochlorothiazide  1 tablet Oral Daily  . [START ON 04/28/2013] estradiol  1 Applicatorful Vaginal 2 times weekly  . fentaNYL   Intravenous Q4H   Continuous Infusions: . dextrose 5 % and 0.9% NaCl 50 mL/hr at 04/26/13 0933   PRN Meds:.diphenhydrAMINE, diphenhydrAMINE, naloxone, ondansetron (ZOFRAN) IV, ondansetron (ZOFRAN) IV, oxyCODONE, potassium chloride, senna-docusate, sodium chloride, traMADol  General appearance: alert, cooperative and no distress Heart: regular rate and rhythm Lungs: mildly dim in bases Abdomen: soft, nontender, nondistended Wound: incisions healing well  Lab Results: CBC: Recent Labs  04/26/13 0520 04/27/13 0510  WBC 8.2 7.7  HGB 11.6* 11.6*  HCT 33.4* 34.0*  PLT 153 139*   BMET:  Recent Labs   04/26/13 0520 04/27/13 0510  NA 142 141  K 3.2* 3.5  CL 108 108  CO2 25 27  GLUCOSE 139* 99  BUN 12 8  CREATININE 0.69 0.61  CALCIUM 7.8* 8.2*    PT/INR: No results found for this basename: LABPROT, INR,  in the last 72 hours Radiology: Dg Chest Port 1 View  04/26/2013   CLINICAL DATA:  Chest tube  EXAM: PORTABLE CHEST - 1 VIEW  COMPARISON:  Portable exam 0611 hr compared to 04/25/2013  FINDINGS: Left subclavian central venous catheter with tip projecting over SVC.  Left thoracostomy tube unchanged, tip at apex.  Stable heart size, mediastinal contours and pulmonary vascularity.  Persistent bibasilar atelectasis greater on the left.  Remaining lungs clear.  No gross pleural effusion or pneumothorax.  IMPRESSION: Bibasilar atelectasis greater on left.  Left thoracostomy tube without pneumothorax.   Electronically Signed   By: Ulyses Southward M.D.   On: 04/26/2013 08:02   Dg Chest Portable 1 View  04/25/2013   CLINICAL DATA:  67 year old female status post chest tube placement.  EXAM: PORTABLE CHEST - 1 VIEW  COMPARISON:  0607 hr the same day and earlier.  FINDINGS: Portable AP semi upright view at a 1023 hr. Left subclavian central line and left chest tube in place. Tip at the SVC level just above the carinal. No pneumothorax identified.  Lower lung volumes. Confluent  retrocardiac opacity. Midline surgical clips projecting over the trachea are stable. Stable cardiac size and mediastinal contours. Aside from crowding, the right lung appears clear.  IMPRESSION: 1.  Left subclavian central line placed, tip at the SVC level.  2. Left chest tube in place. No pneumothorax identified.  3. Lower lung volumes. Confluent left lower lobe atelectasis or consolidation.   Electronically Signed   By: Augusto Gamble M.D.   On: 04/25/2013 11:54     Assessment/Plan: S/P Procedure(s) (LRB): VIDEO ASSISTED THORACOSCOPY, LEFT LOWER LOBE WEDGE RESECTION AND LYMPH NODE SAMPLING (Left)  1  Chest xray not done yet-no air  leak, prob d/c tube later today 2 d/c foley 3 prob d/c PCA after CT out 4 HR ok on home Ziac, SBP somewhat elevated at times- monitor 5 push rehab/pulm toilet 6 final path - pending 7 poss home 1-2 days GOLD,WAYNE E 04/27/2013 7:34 AM

## 2013-04-28 ENCOUNTER — Inpatient Hospital Stay (HOSPITAL_COMMUNITY): Payer: Medicare Other

## 2013-04-28 DIAGNOSIS — J95811 Postprocedural pneumothorax: Secondary | ICD-10-CM | POA: Diagnosis not present

## 2013-04-28 MED ORDER — OXYCODONE HCL 5 MG PO TABS: 5.0000 mg | ORAL_TABLET | ORAL | Status: DC | PRN

## 2013-04-28 MED ORDER — PNEUMOCOCCAL VAC POLYVALENT 25 MCG/0.5ML IJ INJ
0.5000 mL | INJECTION | INTRAMUSCULAR | Status: AC
  Administered 2013-04-28: 0.5 mL via INTRAMUSCULAR
  Filled 2013-04-28: qty 0.5

## 2013-04-28 NOTE — Care Management Note (Signed)
    Page 1 of 1   04/28/2013     12:08:42 PM   CARE MANAGEMENT NOTE 04/28/2013  Patient:  Doris Barton, Doris Barton   Account Number:  [id number]  Date Initiated:  04/26/2013  Documentation initiated by:  Donn Pierini  Subjective/Objective Assessment:   Pt admitted s/p VATS     Action/Plan:   PTA pt lived at home with spouse- anticipate return home - NCM to follow   Anticipated DC Date:  04/28/2013   Anticipated DC Plan:  HOME/SELF CARE      DC Planning Services  CM consult      Choice offered to / List presented to:             Status of service:  Completed, signed off Medicare Important Message given?   (If response is "NO", the following Medicare IM given date fields will be blank) Date Medicare IM given:   Date Additional Medicare IM given:    Discharge Disposition:  HOME/SELF CARE  Per UR Regulation:  Reviewed for med. necessity/level of care/duration of stay  If discussed at Long Length of Stay Meetings, dates discussed:    Comments:

## 2013-04-28 NOTE — Progress Notes (Signed)
Suture removed per order. Patient tolerated well. CVC line removed per Protocol. Patient tolerated well. Will continue to monitor patient

## 2013-04-28 NOTE — Progress Notes (Addendum)
[facility] DAILY ICU PROGRESS NOTE                   [address]            [place] [address]          [phone]   3 Days Post-Op Procedure(s) (LRB): VIDEO ASSISTED THORACOSCOPY, LEFT LOWER LOBE WEDGE RESECTION AND LYMPH NODE SAMPLING (Left)  Total Length of Stay:  LOS: 3 days   Subjective: Feels good, no specific complaints. Ready for discharge  Objective: Vital signs in last 24 hours: Temp:  [97.6 F (36.4 C)-98.7 F (37.1 C)] 98.6 F (37 C) (09/25 0400) Pulse Rate:  [58-67] 58 (09/25 0400) Cardiac Rhythm:  [-] Sinus bradycardia (09/25 0400) Resp:  [13-20] 14 (09/25 0400) BP: (145-154)/(65-71) 145/71 mmHg (09/25 0400) SpO2:  [95 %-98 %] 95 % (09/25 0400)  Filed Weights   04/25/13 1210  Weight: 140 lb (63.504 kg)    Weight change:    Hemodynamic parameters for last 24 hours:    Intake/Output from previous day: 09/24 0701 - 09/25 0700 In: 1460 [P.O.:460; I.V.:900; IV Piggyback:100] Out: 1760 [Urine:1750; Drains:10]  Intake/Output this shift:    Current Meds: Scheduled Meds: . aspirin EC  325 mg Oral Daily  . bisacodyl  10 mg Oral Daily  . bisoprolol-hydrochlorothiazide  1 tablet Oral Daily  . estradiol  1 Applicatorful Vaginal 2 times weekly   Continuous Infusions: . dextrose 5 % and 0.9% NaCl 1,000 mL (04/27/13 1108)   PRN Meds:.influenza vac split quadrivalent PF, ondansetron (ZOFRAN) IV, oxyCODONE, potassium chloride, senna-docusate, traMADol  General appearance: alert, cooperative and no distress Heart: regular rate and rhythm Lungs: clear to auscultation bilaterally Abdomen: benign  Extremities: no edema Wound: incisios healing well  Lab Results: CBC: Recent Labs  04/26/13 0520 04/27/13 0510  WBC 8.2 7.7  HGB 11.6* 11.6*  HCT 33.4* 34.0*  PLT 153 139*   BMET:  Recent Labs  04/26/13 0520 04/27/13 0510  NA 142 141  K 3.2* 3.5  CL 108 108  CO2 25 27  GLUCOSE 139* 99  BUN 12 8  CREATININE 0.69 0.61  CALCIUM 7.8*  8.2*    PT/INR: No results found for this basename: LABPROT, INR,  in the last 72 hours Radiology: Dg Chest Port 1 View  04/27/2013   CLINICAL DATA:  Postop from vats. The left lower lobe mass. Chest tube removal.  EXAM: PORTABLE CHEST - 1 VIEW  COMPARISON:  Prior today  FINDINGS: Left chest tube has been removed. Tiny less than 5% left apical pneumothorax is visualized. The opacity in left retrocardiac lung base shows no significant change. Right lung is clear. Heart size is normal. Left subclavian central venous catheter remains in appropriate position.  IMPRESSION: Tiny less than 5% left apical pneumothorax following chest tube removal.   Electronically Signed   By: Myles Rosenthal   On: 04/27/2013 12:00   Dg Chest Port 1 View  04/27/2013   CLINICAL DATA:  Left chest tube, chest discomfort, weakness, followup, history hypertension, asthma, post VATS on 04/25/2013  EXAM: PORTABLE CHEST - 1 VIEW  COMPARISON:  Portable exam 0759 hr compared to 04/26/2013  FINDINGS: Left subclavian central venous catheter and left thoracostomy tube stable.  Minimal enlargement of cardiac silhouette.  Mediastinal contours and pulmonary vascularity normal.  Persistent atelectasis or consolidation in left lower lobe, slightly improved.  Remaining lungs clear.  No pleural effusion or pneumothorax.  Osseous structures unremarkable.  IMPRESSION: Mild atelectasis  versus consolidation in left lower lobe, slightly improved.   Electronically Signed   By: Ulyses Southward M.D.   On: 04/27/2013 08:10     Assessment/Plan: S/P Procedure(s) (LRB): VIDEO ASSISTED THORACOSCOPY, LEFT LOWER LOBE WEDGE RESECTION AND LYMPH NODE SAMPLING (Left) Plan for discharge: see discharge orders     GOLD,WAYNE E 04/28/2013 7:28 AM  Patient seen and examined, agree with above Dc home today Case/ Path discussed at [facility]- no additional therapy needed at this time- tissue to be sent for EGFR and ALK

## 2013-04-28 NOTE — Progress Notes (Signed)
Patient and family given D/C instructions. They stated that they understood the instructions and signed paper work. Belonging were sent with patient. Patient wheel off the unit via wheelchair accompanied by  [employer] and family.

## 2013-05-02 ENCOUNTER — Encounter (HOSPITAL_COMMUNITY): Payer: Self-pay | Admitting: Surgical

## 2013-05-02 NOTE — Discharge Summary (Signed)
[address]       [place] [address]             [phone]       Doris Barton Nov 15, 1945 67 y.o. [id number]  04/25/2013 04/28/2013  No att. providers found  LLL LUNG NODULE   HPI:  This is a 67 y.o. female smoker who was found to have a left lower lobe nodule on CT scan about 3 years ago. She was being worked up for hematuria at that time. A CT showed a 1 cm nodule in the left lower lobe. This has been followed with serial CT scans showing no signs of change. A 3 year followup CT, however showed signs of growth. A PET scan showed it to be not hypermetabolic, but given the growth there was concern that it still could be a low-grade adenocarcinoma in situ. Dr. Dorris Fetch recommended removal of the lesion and she was admitted this hospitalization for the procedure. Past medical history  Hypertension  Diverticulosis  Hematuria  Vaginitis  Solitary left lower lobe lung nodule  Current Outpatient Prescriptions   Medication  Sig  Dispense  Refill   .  aspirin 325 MG EC tablet  Take 325 mg by mouth daily.     .  bisoprolol-hydrochlorothiazide (ZIAC) 5-6.25 MG per tablet  1 by mouth every morning  100 tablet  3   .  EPIPEN 2-PAK 0.3 MG/0.3ML DEVI  Inject 0.3 mg into the skin once.     Marland Kitchen  estradiol (ESTRACE VAGINAL) 0.1 MG/GM vaginal cream  Apply small amounts twice weekly  42.5 g  11      Hospital Course:  The patient was admitted to the hospital and taken to the operating room on 04/25/2013 and underwent Procedure(s):   No results found for this basename: NA, K, CL, CO2, GLUCOSE, BUN, CRATININE, CALCIUM,  in the last 72 hours DATE OF PROCEDURE: 04/25/2013  DATE OF DISCHARGE:  OPERATIVE REPORT  PREOPERATIVE DIAGNOSIS: Left lower lobe nodule.  POSTOPERATIVE DIAGNOSIS: Atypical glandular proliferation, possible  adenocarcinoma with lepidic spread.  PROCEDURE: Left video-assisted thoracoscopic wedge resection with lymph  node sampling.  SURGEON: Salvatore Decent.  Dorris Fetch, M.D.  ASSISTANT: Rowe Clack, PA-C  ANESTHESIA: General.  FINDINGS: Approximately 1 x 1.5 cm mass in the basilar portion of the  left lower lobe. Frozen section revealed atypical glandular  proliferation, possible adenocarcinoma with lepidic growth. Margins  Clear. Enlarged, but otherwise benign-appearing level 5 lymph nodes.  The patient was extubated in the operating room, taken to the  postanesthetic care unit in good condition. All sponge, needle, and  instrument counts were correct at the end of procedure.   postoperative hospital course:  The patient has overall done quite well. She is maintained stable hemodynamics. All routine lines, monitors and drainage devices were discontinued in the standard fashion. Oxygen was weaned and she maintained her good saturations on room air. She tolerated gradually increasing activities using standard protocols. Incisions were healing well without evidence of infection. Pathology revealed this to be an invasive adenocarcinoma. Please see the complete pathology report for full details. On the morning of 04/28/2013 she was felt to be quite stable for discharge..  No results found for this basename: WBC, HGB, HCT, PLT,  in the last 72 hours No results found for this basename: INR,  in the last 72 hours   Discharge Instructions:  The patient is discharged to home with extensive instructions on wound care and progressive  ambulation.  They are instructed not to drive or perform any heavy lifting until returning to see the physician in his office.  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 Lung nodule, solitary 02/15/2013  . HEMATURIA 12/15/2007  . VAGINITIS, ATROPHIC, POSTMENOPAUSAL 11/04/2007  . HYPERTENSION 04/12/2007  . DIVERTICULOSIS, COLON 04/12/2007   Past Medical  History  Diagnosis Date  . PONV (postoperative nausea and vomiting)   . Hypertension   . Asthma     last flare 6 mos to year ago  . Blood in urine 3 years ago  . Headache(784.0)     migraines none in last 10-15 years ago  . Arthritis         Medication List         aspirin 325 MG EC tablet  Take 325 mg by mouth daily.     bisoprolol-hydrochlorothiazide 5-6.25 MG per tablet  Commonly known as:  ZIAC  1 by mouth every morning     EPIPEN 2-PAK 0.3 mg/0.3 mL Soaj injection  Generic drug:  EPINEPHrine  Inject 0.3 mg into the skin once.     estradiol 0.1 MG/GM vaginal cream  Commonly known as:  ESTRACE VAGINAL  Apply small amounts twice weekly     oxyCODONE 5 MG immediate release tablet  Commonly known as:  Oxy IR/ROXICODONE  Take 1-2 tablets (5-10 mg total) by mouth every 4 (four) hours as needed for pain.       Follow-up Information   Follow up with Loreli Slot, MD. (2 weeks- office will contact you)    Specialty:  Cardiothoracic Surgery   Contact information:   [address] [place] [address] [phone]       Disposition: Discharged home  Patient's condition is Excellent  Gershon Crane, PA-C 05/02/2013  10:01 AM

## 2013-05-09 ENCOUNTER — Other Ambulatory Visit: Payer: Self-pay | Admitting: *Deleted

## 2013-05-09 DIAGNOSIS — R911 Solitary pulmonary nodule: Secondary | ICD-10-CM

## 2013-05-10 ENCOUNTER — Ambulatory Visit (INDEPENDENT_AMBULATORY_CARE_PROVIDER_SITE_OTHER): Payer: Self-pay | Admitting: Thoracic Surgery (Cardiothoracic Vascular Surgery)

## 2013-05-10 ENCOUNTER — Ambulatory Visit
Admission: RE | Admit: 2013-05-10 | Discharge: 2013-05-10 | Disposition: A | Discharge status: Home / Self Care | Payer: Medicare Other | Source: Ambulatory Visit | Attending: Thoracic Surgery (Cardiothoracic Vascular Surgery) | Admitting: Thoracic Surgery (Cardiothoracic Vascular Surgery)

## 2013-05-10 ENCOUNTER — Encounter: Payer: Self-pay | Admitting: Thoracic Surgery (Cardiothoracic Vascular Surgery)

## 2013-05-10 DIAGNOSIS — J984 Other disorders of lung: Secondary | ICD-10-CM

## 2013-05-10 DIAGNOSIS — C3490 Malignant neoplasm of unspecified part of unspecified bronchus or lung: Secondary | ICD-10-CM

## 2013-05-10 DIAGNOSIS — R918 Other nonspecific abnormal finding of lung field: Secondary | ICD-10-CM | POA: Diagnosis not present

## 2013-05-10 DIAGNOSIS — Z09 Encounter for follow-up examination after completed treatment for conditions other than malignant neoplasm: Secondary | ICD-10-CM

## 2013-05-10 DIAGNOSIS — R911 Solitary pulmonary nodule: Secondary | ICD-10-CM

## 2013-05-10 NOTE — Progress Notes (Signed)
HPI:  Doris Barton returns today for a scheduled postoperative followup visit.  She is a 67 year old lady who had been followed for several years with a groundglass nodule in the left lower lobe. Between the 2 and 3 year mark it showed signs of growth so we went ahead and did a wedge resection on September 22. Postoperatively she did well and was discharged after a few days. She did not have any complications.  The mass did turn out to be an stage IA (T1 N0) adenocarcinoma.  She says she feels well. She does have some "tenderness" but does not take any pain medication since discharge. She was using Tylenol for a few days but hasn't taken it at all over the past week. Her breathing is at baseline. She wishes to resume full activities.  Past Medical History  Diagnosis Date  . PONV (postoperative nausea and vomiting)   . Hypertension   . Asthma     last flare 6 mos to year ago  . Blood in urine 3 years ago  . Headache(784.0)     migraines none in last 10-15 years ago  . Arthritis   . Carcinoma, lung     Invasive adenocarcinoma status post LLL wedge resection with negative margin      Current Outpatient Prescriptions  Medication Sig Dispense Refill  . aspirin 325 MG EC tablet Take 325 mg by mouth daily.        . bisoprolol-hydrochlorothiazide (ZIAC) 5-6.25 MG per tablet 1 by mouth every morning  100 tablet  3  . EPIPEN 2-PAK 0.3 MG/0.3ML DEVI Inject 0.3 mg into the skin once.       Marland Kitchen estradiol (ESTRACE VAGINAL) 0.1 MG/GM vaginal cream Apply small amounts twice weekly  42.5 g  11  . oxyCODONE (OXY IR/ROXICODONE) 5 MG immediate release tablet Take 1-2 tablets (5-10 mg total) by mouth every 4 (four) hours as needed for pain.  50 tablet  0   No current facility-administered medications for this visit.    Physical Exam BP 158/78  Pulse 60  Resp 20  Ht 5\' 2"  (1.575 m)  Wt 140 lb (63.504 kg)  BMI 25.6 kg/m2  SpO27 40% 67 year old woman in no acute distress Alert and  oriented Cardiac regular rate and rhythm no murmur Lungs clear with equal breath sounds bilaterally Incisions well healed  Diagnostic Tests: Chest x-ray 05/10/13 CLINICAL DATA: History of lung surgery 2 weeks ago. No current  complaints.  EXAM:  CHEST 2 VIEW  COMPARISON: 04/28/2013  FINDINGS:  Minimal left apical pneumothorax described previously is no longer  appreciated.  Left lung base opacity silhouettes the left hemidiaphragm. This is  likely a combination of atelectasis and postsurgical scarring. An  anastomosis staple line is noted at the posterior left lung base.  There is minor apical parenchymal scarring. The lungs are otherwise  clear.  Minimal pleural fluid is noted posteriorly on the left. No right  pleural effusion.  Cardiac silhouette is normal in size and configuration. Two surgical  vascular clips project within the AP window, stable. The mediastinum  is otherwise unremarkable. No hilar masses.  The bony thorax is demineralized but intact.  IMPRESSION:  No acute cardiopulmonary disease.  Postsurgical changes at the left lung base have mildly improved from  the prior exam with less overall density.  Resolved left apical pneumothorax.  Electronically Signed  By: Amie Portland M.D.  On: 05/10/2013 12:08  Impression: 67 year old woman with a stage Ia non-small cell lung cancer. This was  a very slow growing lesion. In fact it showed no growth at all for 2 years. She had a wedge resection with clear margins on September 22. She is doing extremely well postoperatively. She has a prescription for oxycodone but has not taken any. At this point shenanigan using Tylenol.  I given that this was a cancer we will need to follow her. I will plan to see her back in 3 months with a chest x-ray. We'll plan to do another CT scan in about a year.  She is anxious to resume her full activities. Since she's not using any pain medication I do think it is okay for her to start driving.  I cautioned her to be very cautious for the first couple of weeks as her reaction time be back to normal quite yet. Her activities are otherwise unrestricted but she was cautioned to build into new activities gradually.  Plan:  Return in 3 months with PA and lateral chest x-ray

## 2013-05-11 DIAGNOSIS — N6009 Solitary cyst of unspecified breast: Secondary | ICD-10-CM | POA: Diagnosis not present

## 2013-05-11 DIAGNOSIS — Z09 Encounter for follow-up examination after completed treatment for conditions other than malignant neoplasm: Secondary | ICD-10-CM | POA: Diagnosis not present

## 2013-05-16 ENCOUNTER — Other Ambulatory Visit: Payer: Self-pay | Admitting: Radiology

## 2013-05-16 DIAGNOSIS — N6019 Diffuse cystic mastopathy of unspecified breast: Secondary | ICD-10-CM | POA: Diagnosis not present

## 2013-05-16 DIAGNOSIS — D249 Benign neoplasm of unspecified breast: Secondary | ICD-10-CM | POA: Diagnosis not present

## 2013-05-16 DIAGNOSIS — N63 Unspecified lump in unspecified breast: Secondary | ICD-10-CM | POA: Diagnosis not present

## 2013-05-16 DIAGNOSIS — J309 Allergic rhinitis, unspecified: Secondary | ICD-10-CM | POA: Diagnosis not present

## 2013-05-17 DIAGNOSIS — H1045 Other chronic allergic conjunctivitis: Secondary | ICD-10-CM | POA: Diagnosis not present

## 2013-05-17 DIAGNOSIS — J3089 Other allergic rhinitis: Secondary | ICD-10-CM | POA: Diagnosis not present

## 2013-05-17 DIAGNOSIS — J301 Allergic rhinitis due to pollen: Secondary | ICD-10-CM | POA: Diagnosis not present

## 2013-05-17 DIAGNOSIS — J3081 Allergic rhinitis due to animal (cat) (dog) hair and dander: Secondary | ICD-10-CM | POA: Diagnosis not present

## 2013-05-30 DIAGNOSIS — J309 Allergic rhinitis, unspecified: Secondary | ICD-10-CM | POA: Diagnosis not present

## 2013-06-01 DIAGNOSIS — J309 Allergic rhinitis, unspecified: Secondary | ICD-10-CM | POA: Diagnosis not present

## 2013-06-06 DIAGNOSIS — J309 Allergic rhinitis, unspecified: Secondary | ICD-10-CM | POA: Diagnosis not present

## 2013-06-14 DIAGNOSIS — J309 Allergic rhinitis, unspecified: Secondary | ICD-10-CM | POA: Diagnosis not present

## 2013-07-11 DIAGNOSIS — J309 Allergic rhinitis, unspecified: Secondary | ICD-10-CM | POA: Diagnosis not present

## 2013-07-14 DIAGNOSIS — J309 Allergic rhinitis, unspecified: Secondary | ICD-10-CM | POA: Diagnosis not present

## 2013-07-18 DIAGNOSIS — J309 Allergic rhinitis, unspecified: Secondary | ICD-10-CM | POA: Diagnosis not present

## 2013-08-09 ENCOUNTER — Other Ambulatory Visit: Payer: Self-pay | Admitting: Family Medicine

## 2013-08-09 DIAGNOSIS — J309 Allergic rhinitis, unspecified: Secondary | ICD-10-CM | POA: Diagnosis not present

## 2013-08-16 ENCOUNTER — Ambulatory Visit: Payer: Medicare Other | Admitting: Thoracic Surgery (Cardiothoracic Vascular Surgery)

## 2013-08-17 DIAGNOSIS — J309 Allergic rhinitis, unspecified: Secondary | ICD-10-CM | POA: Diagnosis not present

## 2013-08-26 ENCOUNTER — Other Ambulatory Visit: Payer: Self-pay | Admitting: *Deleted

## 2013-08-26 DIAGNOSIS — J309 Allergic rhinitis, unspecified: Secondary | ICD-10-CM | POA: Diagnosis not present

## 2013-08-26 DIAGNOSIS — R911 Solitary pulmonary nodule: Secondary | ICD-10-CM

## 2013-08-29 DIAGNOSIS — J309 Allergic rhinitis, unspecified: Secondary | ICD-10-CM | POA: Diagnosis not present

## 2013-08-30 ENCOUNTER — Ambulatory Visit
Admission: RE | Admit: 2013-08-30 | Discharge: 2013-08-30 | Disposition: A | Discharge status: Home / Self Care | Payer: Medicare Other | Source: Ambulatory Visit | Attending: Thoracic Surgery (Cardiothoracic Vascular Surgery) | Admitting: Thoracic Surgery (Cardiothoracic Vascular Surgery)

## 2013-08-30 ENCOUNTER — Ambulatory Visit (INDEPENDENT_AMBULATORY_CARE_PROVIDER_SITE_OTHER): Payer: Medicare Other | Admitting: Thoracic Surgery (Cardiothoracic Vascular Surgery)

## 2013-08-30 VITALS — BP 151/84 | HR 56 | Resp 16 | Ht 62.0 in | Wt 130.0 lb

## 2013-08-30 DIAGNOSIS — Z09 Encounter for follow-up examination after completed treatment for conditions other than malignant neoplasm: Secondary | ICD-10-CM

## 2013-08-30 DIAGNOSIS — R911 Solitary pulmonary nodule: Secondary | ICD-10-CM

## 2013-08-30 DIAGNOSIS — C3430 Malignant neoplasm of lower lobe, unspecified bronchus or lung: Secondary | ICD-10-CM

## 2013-08-30 DIAGNOSIS — R918 Other nonspecific abnormal finding of lung field: Secondary | ICD-10-CM | POA: Diagnosis not present

## 2013-08-30 NOTE — Progress Notes (Signed)
  HPI:  Doris Barton returns today for a 3 month followup visit.  She had a left lower lobe groundglass nodule that remained stable for a couple of years but between years 2 and 3 had grown and developed a soft tissue component. We did a wedge resection and node dissection on her on September 22. On the original frozen it was suspicious for adenocarcinoma with lepidic spread, so we did not do a lobectomy. The lymph nodes were all negative. On the final path there was an invasive component to the tumor and she had a stage IA disease.  Says that she's feeling well. She doesn't really have any pain from the incision. Her breathing is normal. Her weight has been stable. She's not had any cough, hemoptysis, unusual headaches or visual changes, unusual bone or joint complaints.  Past Medical History  Diagnosis Date  . PONV (postoperative nausea and vomiting)   . Hypertension   . Asthma     last flare 6 mos to year ago  . Blood in urine 3 years ago  . Headache(784.0)     migraines none in last 10-15 years ago  . Arthritis   . Carcinoma, lung     Invasive adenocarcinoma status post LLL wedge resection with negative margin     Current Outpatient Prescriptions  Medication Sig Dispense Refill  . aspirin 325 MG EC tablet Take 325 mg by mouth daily.        . bisoprolol-hydrochlorothiazide (ZIAC) 5-6.25 MG per tablet take 1 tablet by mouth every morning  100 tablet  3  . EPIPEN 2-PAK 0.3 MG/0.3ML DEVI Inject 0.3 mg into the skin once.       Marland Kitchen estradiol (ESTRACE VAGINAL) 0.1 MG/GM vaginal cream Apply small amounts twice weekly  42.5 g  11   No current facility-administered medications for this visit.   ROS  See HPI   Physical Exam BP 151/84  Pulse 56  Resp 16  Ht $R'5\' 2"'Au$  (1.575 m)  Wt 130 lb (58.968 kg)  BMI 23.77 kg/m2  SpO2 98%  Diagnostic Tests: Chest x-ray 08/30/2013 CHEST 2 VIEW  COMPARISON: 05/10/2013  FINDINGS:  The cardiomediastinal silhouette is within normal limits. The  lungs  are well inflated. Staples are again noted in the left lower lobe.  Left lower lobe opacity has continued to improve. The right lung  remains clear. There is mild blunting of the left costophrenic angle  which may represent trace residual pleural fluid. No pneumothorax is  identified. No acute osseous abnormality is seen.  IMPRESSION:  Decreased left lower lobe lobe opacity, consistent with resolving  postoperative changes. No evidence of new abnormality.  Electronically Signed  By: Logan Bores  On: 08/30/2013 09:52  Impression: 68 year old woman who is now about 4 months out from a wedge resection for a small stage IA non-small cell cancer. She has no evidence recurrent disease. Her chest x-ray shows resolving postoperative changes. She's feeling well and has had no adverse events related to the surgery.  She had questions regarding the etiology. I really can't answer that. She has no known risk factors. EGFR and ALK testing were negative.  Plan: Returns 3 months for 6 month followup.  We will do a CT of the chest at that time.

## 2013-09-08 DIAGNOSIS — J309 Allergic rhinitis, unspecified: Secondary | ICD-10-CM | POA: Diagnosis not present

## 2013-09-16 DIAGNOSIS — J309 Allergic rhinitis, unspecified: Secondary | ICD-10-CM | POA: Diagnosis not present

## 2013-09-26 DIAGNOSIS — J309 Allergic rhinitis, unspecified: Secondary | ICD-10-CM | POA: Diagnosis not present

## 2013-10-06 DIAGNOSIS — J309 Allergic rhinitis, unspecified: Secondary | ICD-10-CM | POA: Diagnosis not present

## 2013-10-17 ENCOUNTER — Other Ambulatory Visit: Payer: Self-pay | Admitting: *Deleted

## 2013-10-17 DIAGNOSIS — C3490 Malignant neoplasm of unspecified part of unspecified bronchus or lung: Secondary | ICD-10-CM

## 2013-10-25 DIAGNOSIS — J309 Allergic rhinitis, unspecified: Secondary | ICD-10-CM | POA: Diagnosis not present

## 2013-11-02 ENCOUNTER — Telehealth: Payer: Self-pay | Admitting: Family Medicine

## 2013-11-02 DIAGNOSIS — Z87898 Personal history of other specified conditions: Secondary | ICD-10-CM

## 2013-11-02 DIAGNOSIS — N649 Disorder of breast, unspecified: Secondary | ICD-10-CM

## 2013-11-02 NOTE — Telephone Encounter (Signed)
Pt states she had a breast biopsy done on 05/16/13, and states she has to go back for a fu appt, however her insurance is now needing a referral. Pt needs referral to [facility].

## 2013-11-10 DIAGNOSIS — J309 Allergic rhinitis, unspecified: Secondary | ICD-10-CM | POA: Diagnosis not present

## 2013-11-14 DIAGNOSIS — D249 Benign neoplasm of unspecified breast: Secondary | ICD-10-CM | POA: Diagnosis not present

## 2013-11-14 DIAGNOSIS — N6019 Diffuse cystic mastopathy of unspecified breast: Secondary | ICD-10-CM | POA: Diagnosis not present

## 2013-11-14 DIAGNOSIS — Z9889 Other specified postprocedural states: Secondary | ICD-10-CM | POA: Diagnosis not present

## 2013-11-15 ENCOUNTER — Ambulatory Visit (INDEPENDENT_AMBULATORY_CARE_PROVIDER_SITE_OTHER): Payer: Medicare Other | Admitting: Thoracic Surgery (Cardiothoracic Vascular Surgery)

## 2013-11-15 ENCOUNTER — Encounter: Payer: Self-pay | Admitting: Thoracic Surgery (Cardiothoracic Vascular Surgery)

## 2013-11-15 ENCOUNTER — Ambulatory Visit
Admission: RE | Admit: 2013-11-15 | Discharge: 2013-11-15 | Disposition: A | Discharge status: Home / Self Care | Payer: Medicare Other | Source: Ambulatory Visit | Attending: Thoracic Surgery (Cardiothoracic Vascular Surgery) | Admitting: Thoracic Surgery (Cardiothoracic Vascular Surgery)

## 2013-11-15 ENCOUNTER — Other Ambulatory Visit: Payer: Medicare Other

## 2013-11-15 VITALS — BP 150/78 | HR 60 | Resp 20 | Ht 62.0 in | Wt 130.0 lb

## 2013-11-15 DIAGNOSIS — C3430 Malignant neoplasm of lower lobe, unspecified bronchus or lung: Secondary | ICD-10-CM | POA: Diagnosis not present

## 2013-11-15 DIAGNOSIS — C3490 Malignant neoplasm of unspecified part of unspecified bronchus or lung: Secondary | ICD-10-CM | POA: Diagnosis not present

## 2013-11-15 NOTE — Progress Notes (Signed)
HPI:  Doris Barton returns today for a scheduled 6 month followup visit.  She is a 68 year old woman who had a wedge resection for a stage IA non-small cell carcinoma back in September of 2014. This had been a groundglass nodule that grew and developed a soft tissue component. We resected her with good margins. There was a small invasive component to the tumor in the setting of an adenocarcinoma with lepidic growth.  She has done well since the time of surgery and continues to do well. She has not had any pain, shortness of breath, cough, hemoptysis, wheezing, weight loss, change in appetite, new bone or joint pain, or headaches or visual changes. He's not having any pain from the surgery.  Past Medical History  Diagnosis Date  . PONV (postoperative nausea and vomiting)   . Hypertension   . Asthma     last flare 6 mos to year ago  . Blood in urine 3 years ago  . Headache(784.0)     migraines none in last 10-15 years ago  . Arthritis   . Carcinoma, lung     Invasive adenocarcinoma status post LLL wedge resection with negative margin     Current Outpatient Prescriptions  Medication Sig Dispense Refill  . aspirin 325 MG EC tablet Take 325 mg by mouth daily.        . bisoprolol-hydrochlorothiazide (ZIAC) 5-6.25 MG per tablet take 1 tablet by mouth every morning  100 tablet  3  . EPIPEN 2-PAK 0.3 MG/0.3ML DEVI Inject 0.3 mg into the skin once.       Marland Kitchen estradiol (ESTRACE VAGINAL) 0.1 MG/GM vaginal cream Apply small amounts twice weekly  42.5 g  11   No current facility-administered medications for this visit.    Physical Exam BP 150/78  Pulse 60  Resp 20  Ht 5\' 2"  (1.575 m)  Wt 130 lb (58.968 kg)  BMI 23.77 kg/m2  SpO29 47% 68 year old woman in no acute distress Well-developed well-nourished Alert and oriented x3 with no focal deficits Cardiac regular rate and rhythm normal S1 and S2 Lungs clear with equal breath sounds bilaterally Incisions well healed No cervical or  subclavicular adenopathy  Diagnostic Tests: CT chest 11/15/2013 CT CHEST WITHOUT CONTRAST  TECHNIQUE:  Multidetector CT imaging of the chest was performed following the  standard protocol without IV contrast.  COMPARISON: DG CHEST 2 VIEW dated 08/30/2013; CT CHEST W/O CM dated  02/15/2013; NM PET IMAGE INITIAL (PI) SKULL BASE TO THIGH dated  02/18/2013  FINDINGS:  Postsurgical changes noted from wedge resection in the left lower  lobe. Linear densities noted along the suture line in the left lower  lobe compatible with scarring. Linear densities in the lingula  compatible with scarring. No residual nodule or mass. No new  pulmonary nodules. No pleural effusions.  Precarinal lymph node has a short axis diameter of 7 mm, stable. No  mediastinal, axillary or visible hilar adenopathy. Detection of  small hilar lymph nodes is decreased without intravenous contrast.  Heart is upper limits normal in size. Aorta is normal caliber. Chest  wall soft tissues are unremarkable.  Low-density lesion in the right hepatic lobe measures 13 mm compared  with 16 mm previously. Adrenal glands are unremarkable. No acute  findings in the upper abdomen.  IMPRESSION:  Post wedge resection of the previously seen left lower lobe  pulmonary nodule. Areas of scarring in the left lower lobe and  lingula. No evidence of recurrent or residual disease. No visible  metastatic  disease.  Electronically Signed  By: Rolm Baptise M.D.  On: 11/15/2013 10:02  Impression: 69 year old woman with a stage IA non-small cell carcinoma. She is now 6 months post resection. She's doing extremely well. She has no evidence of recurrent disease.  Her blood pressure was elevated the day. She says she's been running between appointments and just had a CT scan. We did not change any medications but we need to keep an eye on that.   Plan: Return in 3 months with a PA and lateral chest x-ray.

## 2013-11-21 DIAGNOSIS — J309 Allergic rhinitis, unspecified: Secondary | ICD-10-CM | POA: Diagnosis not present

## 2013-12-06 DIAGNOSIS — J309 Allergic rhinitis, unspecified: Secondary | ICD-10-CM | POA: Diagnosis not present

## 2013-12-20 DIAGNOSIS — J309 Allergic rhinitis, unspecified: Secondary | ICD-10-CM | POA: Diagnosis not present

## 2013-12-23 ENCOUNTER — Encounter (HOSPITAL_COMMUNITY): Payer: Self-pay

## 2013-12-27 DIAGNOSIS — J309 Allergic rhinitis, unspecified: Secondary | ICD-10-CM | POA: Diagnosis not present

## 2013-12-30 DIAGNOSIS — J309 Allergic rhinitis, unspecified: Secondary | ICD-10-CM | POA: Diagnosis not present

## 2014-01-16 DIAGNOSIS — J309 Allergic rhinitis, unspecified: Secondary | ICD-10-CM | POA: Diagnosis not present

## 2014-02-13 ENCOUNTER — Encounter (HOSPITAL_COMMUNITY): Payer: Self-pay

## 2014-02-14 ENCOUNTER — Ambulatory Visit: Payer: Medicare Other | Admitting: Thoracic Surgery (Cardiothoracic Vascular Surgery)

## 2014-03-21 ENCOUNTER — Ambulatory Visit: Payer: Medicare Other | Admitting: Thoracic Surgery (Cardiothoracic Vascular Surgery)

## 2014-03-28 ENCOUNTER — Ambulatory Visit: Payer: Medicare Other | Admitting: Thoracic Surgery (Cardiothoracic Vascular Surgery)

## 2014-03-31 ENCOUNTER — Other Ambulatory Visit: Payer: Self-pay | Admitting: Thoracic Surgery (Cardiothoracic Vascular Surgery)

## 2014-03-31 DIAGNOSIS — C3430 Malignant neoplasm of lower lobe, unspecified bronchus or lung: Secondary | ICD-10-CM

## 2014-04-04 ENCOUNTER — Encounter: Payer: Self-pay | Admitting: Thoracic Surgery (Cardiothoracic Vascular Surgery)

## 2014-04-04 ENCOUNTER — Ambulatory Visit
Admission: RE | Admit: 2014-04-04 | Discharge: 2014-04-04 | Disposition: A | Discharge status: Home / Self Care | Payer: Medicare Other | Source: Ambulatory Visit | Attending: Thoracic Surgery (Cardiothoracic Vascular Surgery) | Admitting: Thoracic Surgery (Cardiothoracic Vascular Surgery)

## 2014-04-04 ENCOUNTER — Ambulatory Visit: Payer: Medicare Other | Admitting: Thoracic Surgery (Cardiothoracic Vascular Surgery)

## 2014-04-04 ENCOUNTER — Other Ambulatory Visit: Payer: Self-pay | Admitting: *Deleted

## 2014-04-04 ENCOUNTER — Ambulatory Visit (INDEPENDENT_AMBULATORY_CARE_PROVIDER_SITE_OTHER): Payer: Medicare Other | Admitting: Thoracic Surgery (Cardiothoracic Vascular Surgery)

## 2014-04-04 VITALS — BP 157/72 | HR 60 | Ht 62.0 in | Wt 130.0 lb

## 2014-04-04 DIAGNOSIS — C3430 Malignant neoplasm of lower lobe, unspecified bronchus or lung: Secondary | ICD-10-CM

## 2014-04-04 DIAGNOSIS — I10 Essential (primary) hypertension: Secondary | ICD-10-CM | POA: Diagnosis not present

## 2014-04-04 NOTE — Progress Notes (Signed)
  HPI:  Doris Barton returns for a scheduled followup visit. She is a 68 year old woman who had a wedge resection for stage IA non-small cell carcinoma back in September of 2014. This was a groundglass opacity that over time developed a sub-solid component. She was last seen in the office in April at which time she was doing well. A CT showed no evidence of recurrence. She was supposed to come back in July with a chest x-ray but was moving at that time and postponed her visit until now.  She says that she's been feeling well. She denies cough or hemoptysis. Her weight is stable. She does not have any new or unusual bone or joint pain or headaches or visual changes.  Past Medical History  Diagnosis Date  . PONV (postoperative nausea and vomiting)   . Hypertension   . Asthma     last flare 6 mos to year ago  . Blood in urine 3 years ago  . Headache(784.0)     migraines none in last 10-15 years ago  . Arthritis   . Carcinoma, lung     Invasive adenocarcinoma status post LLL wedge resection with negative margin     Current Outpatient Prescriptions  Medication Sig Dispense Refill  . aspirin 325 MG EC tablet Take 325 mg by mouth daily.        . bisoprolol-hydrochlorothiazide (ZIAC) 5-6.25 MG per tablet take 1 tablet by mouth every morning  100 tablet  3  . estradiol (ESTRACE VAGINAL) 0.1 MG/GM vaginal cream Apply small amounts twice weekly  42.5 g  11  . EPIPEN 2-PAK 0.3 MG/0.3ML DEVI Inject 0.3 mg into the skin once.        No current facility-administered medications for this visit.    Physical Exam BP 157/72  Pulse 60  Ht 5\' 2"  (1.575 m)  Wt 130 lb (58.968 kg)  BMI 23.77 kg/m2  SpO27 31% 68 year old woman in no acute distress Well-developed well-nourished Alert and oriented x3 with no focal deficits Cardiac regular rate and rhythm No cervical or supraclavicular adenopathy Lungs clear equal breath sounds bilaterally Incisions well healed  Diagnostic Tests: Chest x-ray  today CHEST 2 VIEW  COMPARISON: CT chest 11/15/2013 and PA and lateral chest  08/30/2013.  FINDINGS:  Left lower lobe scar is unchanged. Lungs are otherwise clear. Heart  size is normal. No pneumothorax or pleural effusion. No focal bony  abnormality.  IMPRESSION:  No acute disease. Stable compared to prior exams.  Electronically Signed  By: Inge Rise M.D.  On: 04/04/2014 12:53   Impression: 68 year old woman who is now a year out from a wedge resection for a well-differentiated stage IA adenocarcinoma. There is no evidence of recurrent disease.  Unfortunately we got into a situation of appointment "creep". She now is at a year out from surgery and needs a CT of the chest. Her last CT was in April, so I will have her come back in October and do a CT of the chest at that time. After that we will see her back at six-month intervals for the next year and then annually.  Plan: Return in 6 weeks with CT of chest for one year followup

## 2014-05-03 DIAGNOSIS — Z23 Encounter for immunization: Secondary | ICD-10-CM | POA: Diagnosis not present

## 2014-05-09 DIAGNOSIS — H25813 Combined forms of age-related cataract, bilateral: Secondary | ICD-10-CM | POA: Diagnosis not present

## 2014-05-16 ENCOUNTER — Ambulatory Visit: Payer: Medicare Other | Admitting: Thoracic Surgery (Cardiothoracic Vascular Surgery)

## 2014-05-16 ENCOUNTER — Other Ambulatory Visit: Payer: Medicare Other

## 2014-05-23 ENCOUNTER — Ambulatory Visit
Admission: RE | Admit: 2014-05-23 | Discharge: 2014-05-23 | Disposition: A | Discharge status: Home / Self Care | Payer: Medicare Other | Source: Ambulatory Visit | Attending: Thoracic Surgery (Cardiothoracic Vascular Surgery) | Admitting: Thoracic Surgery (Cardiothoracic Vascular Surgery)

## 2014-05-23 ENCOUNTER — Ambulatory Visit (INDEPENDENT_AMBULATORY_CARE_PROVIDER_SITE_OTHER): Payer: Medicare Other | Admitting: Thoracic Surgery (Cardiothoracic Vascular Surgery)

## 2014-05-23 ENCOUNTER — Encounter: Payer: Self-pay | Admitting: Thoracic Surgery (Cardiothoracic Vascular Surgery)

## 2014-05-23 VITALS — BP 160/80 | HR 62 | Ht 62.0 in | Wt 130.0 lb

## 2014-05-23 DIAGNOSIS — J984 Other disorders of lung: Secondary | ICD-10-CM | POA: Diagnosis not present

## 2014-05-23 DIAGNOSIS — C3430 Malignant neoplasm of lower lobe, unspecified bronchus or lung: Secondary | ICD-10-CM

## 2014-05-23 DIAGNOSIS — C3432 Malignant neoplasm of lower lobe, left bronchus or lung: Secondary | ICD-10-CM

## 2014-05-23 DIAGNOSIS — Z85118 Personal history of other malignant neoplasm of bronchus and lung: Secondary | ICD-10-CM | POA: Diagnosis not present

## 2014-05-23 NOTE — Progress Notes (Signed)
HPI:  Mrs. Crum returns today for a scheduled followup visit.  She is a 68 year old woman who had a wide wedge resection for a groundglass opacity in the left lower lobe in September of 2014. 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  I saw her in September showed chest x-ray at that time she now returns for her one-year followup with a CT scan. She says she's been feeling well. She has been under a lot of stress with a recent move. She has not had any significant weight loss. Her appetite is good. She has an occasional cough but nothing out of the ordinary. She has not had any hemoptysis. She's not had any shortness of breath or wheezing.   Past Medical History  Diagnosis Date  . PONV (postoperative nausea and vomiting)   . Hypertension   . Asthma     last flare 6 mos to year ago  . Blood in urine 3 years ago  . Headache(784.0)     migraines none in last 10-15 years ago  . Arthritis   . Carcinoma, lung     Invasive adenocarcinoma status post LLL wedge resection with negative margin      Current Outpatient Prescriptions  Medication Sig Dispense Refill  . aspirin 325 MG EC tablet Take 325 mg by mouth daily.        . bisoprolol-hydrochlorothiazide (ZIAC) 5-6.25 MG per tablet take 1 tablet by mouth every morning  100 tablet  3  . estradiol (ESTRACE VAGINAL) 0.1 MG/GM vaginal cream Apply small amounts twice weekly  42.5 g  11  . EPIPEN 2-PAK 0.3 MG/0.3ML DEVI Inject 0.3 mg into the skin once.        No current facility-administered medications for this visit.    Physical Exam BP 160/80  Pulse 62  Ht 5\' 2"  (1.575 m)  Wt 130 lb (58.968 kg)  BMI 23.77 kg/m2  SpO49 86% 68 year old woman in no acute distress Well-developed well-nourished Alert and oriented x3 no focal deficits Cardiac regular rate and rhythm normal S1 and S2 Lungs clear with equal to bilaterally Incisions well healed No cervical or subclavicular  adenopathy  Diagnostic Tests: CT CHEST WITHOUT CONTRAST  TECHNIQUE:  Multidetector CT imaging of the chest was performed following the  standard protocol without IV contrast.  COMPARISON: Chest CT 11/15/2013 and 02/15/2013.  FINDINGS:  Mediastinum: As evaluated in the noncontrast state, the mediastinum  appears stable without evidence of lymphadenopathy. The thyroid  gland, trachea and esophagus demonstrate no significant findings.  Suprasternal surgical clips are stable. Heart size is stable. There  is stable mild aortic atherosclerosis.  Lungs/Pleura: There is no pleural or pericardial effusion.There is  stable postsurgical scarring in the left lower lobe status post  wedge resection. No recurrent pulmonary nodule or airspace opacity  demonstrated. There is stable mild biapical scarring.  Upper abdomen: Stable appearance. A peripheral cyst in the right  hepatic lobe is unchanged. There is no adrenal mass.  Musculoskeletal/Chest wall: No chest wall mass or suspicious osseous  findings.  IMPRESSION:  1. Stable postsurgical scarring in the left lower lobe status post  wedge resection.  2. No evidence of local recurrence or metastatic disease.  Electronically Signed  By: Camie Patience M.D.  On: 05/23/2014 14:00   Impression: 68 year old woman who is now year out from resection of a stage IA non-small cell carcinoma. She is no evidence recurrent disease.  We will plan to rescan  her in 6 months to see her back at that time.  Her blood pressure was elevated again today 160/80. It was high at her last visit as well. She says that she was rushed on both occasions. She has a blood pressure cuff at home that she uses for her husband. I advised her to check her blood pressure every day and make sure that it is not continuing to run in the 150-160 range. If it does she needs to contact Dr. Sherren Mocha.  Plan:  Return in 6 months with CT of chest

## 2014-06-20 IMAGING — CR DG CHEST 2V
2 series · 2 of 2 positions shown · non-contrast
Comparison: 04/28/2013

CLINICAL DATA: History of lung surgery 2 weeks ago. No current
complaints.

EXAM:
CHEST  2 VIEW

[w chest pa]
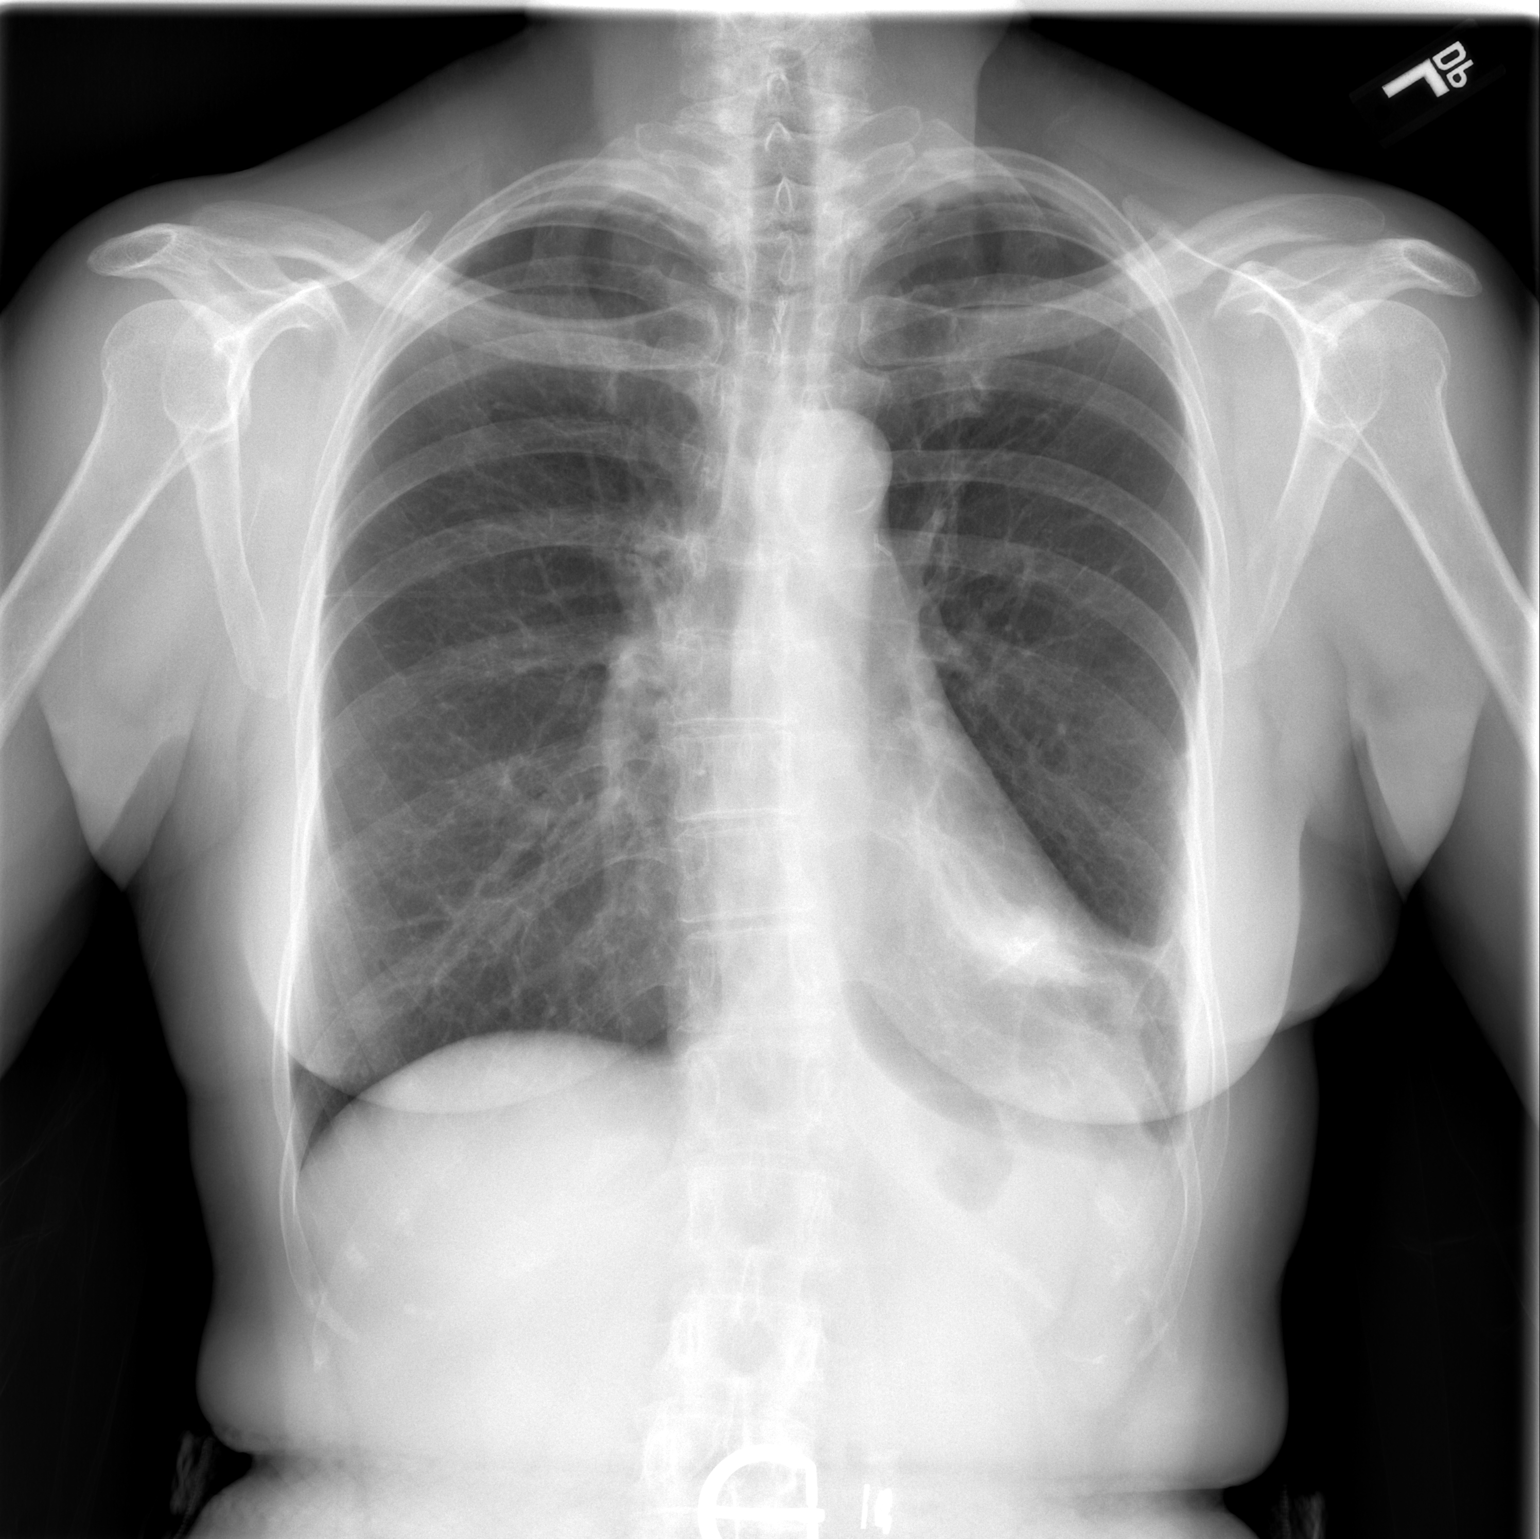

[w chest lat]
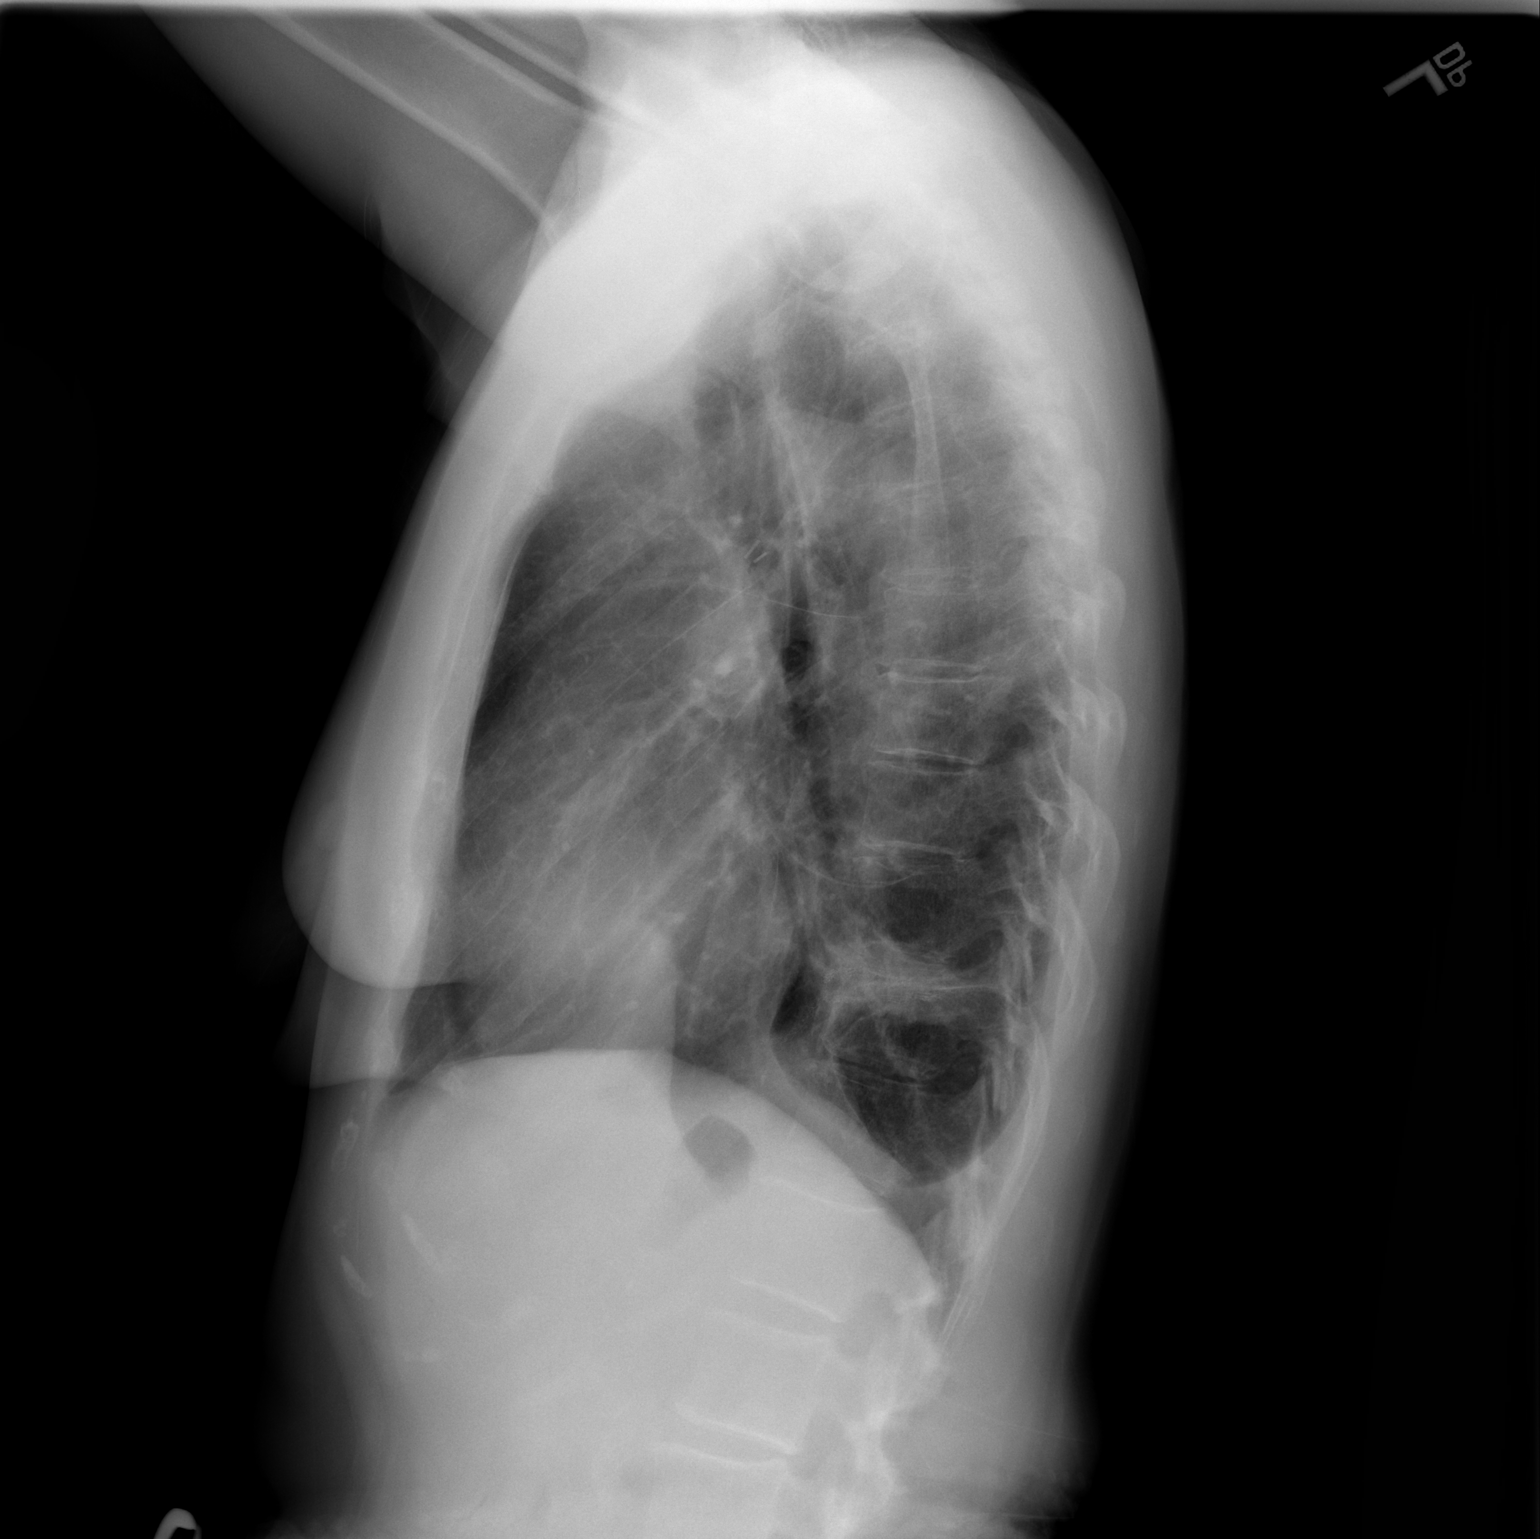

[2 of 2 positions shown; findings below may reference images not displayed]

FINDINGS: Minimal left apical pneumothorax described previously is no longer
appreciated.

Left lung base opacity silhouettes the left hemidiaphragm. This is
likely a combination of atelectasis and postsurgical scarring. An
anastomosis staple line is noted at the posterior left lung base.

There is minor apical parenchymal scarring. The lungs are otherwise
clear.

Minimal pleural fluid is noted posteriorly on the left. No right
pleural effusion.

Cardiac silhouette is normal in size and configuration. Two surgical
vascular clips project within the AP window, stable. The mediastinum
is otherwise unremarkable. No hilar masses.

The bony thorax is demineralized but intact.
IMPRESSION: No acute cardiopulmonary disease.

Postsurgical changes at the left lung base have mildly improved from
the prior exam with less overall density.

Resolved left apical pneumothorax.

## 2014-07-04 ENCOUNTER — Other Ambulatory Visit: Payer: Self-pay | Admitting: Family Medicine

## 2014-07-04 MED ORDER — LISINOPRIL 10 MG PO TABS: 10.0000 mg | ORAL_TABLET | Freq: Every day | ORAL | Status: DC

## 2014-07-10 DIAGNOSIS — J3081 Allergic rhinitis due to animal (cat) (dog) hair and dander: Secondary | ICD-10-CM | POA: Diagnosis not present

## 2014-07-10 DIAGNOSIS — J301 Allergic rhinitis due to pollen: Secondary | ICD-10-CM | POA: Diagnosis not present

## 2014-07-10 DIAGNOSIS — J3089 Other allergic rhinitis: Secondary | ICD-10-CM | POA: Diagnosis not present

## 2014-07-18 ENCOUNTER — Ambulatory Visit (INDEPENDENT_AMBULATORY_CARE_PROVIDER_SITE_OTHER): Payer: Medicare Other | Admitting: Family Medicine

## 2014-07-18 ENCOUNTER — Encounter: Payer: Self-pay | Admitting: Family Medicine

## 2014-07-18 VITALS — BP 120/78 | Temp 98.1°F | Wt 129.0 lb

## 2014-07-18 DIAGNOSIS — I10 Essential (primary) hypertension: Secondary | ICD-10-CM

## 2014-07-18 DIAGNOSIS — J3089 Other allergic rhinitis: Secondary | ICD-10-CM | POA: Diagnosis not present

## 2014-07-18 DIAGNOSIS — J301 Allergic rhinitis due to pollen: Secondary | ICD-10-CM | POA: Diagnosis not present

## 2014-07-18 DIAGNOSIS — J3081 Allergic rhinitis due to animal (cat) (dog) hair and dander: Secondary | ICD-10-CM | POA: Diagnosis not present

## 2014-07-18 LAB — BASIC METABOLIC PANEL
BUN: 19 mg/dL (ref 6–23)
CHLORIDE: 105 meq/L (ref 96–112)
CO2: 24 meq/L (ref 19–32)
Calcium: 9.3 mg/dL (ref 8.4–10.5)
Creatinine, Ser: 0.8 mg/dL (ref 0.4–1.2)
GFR: 80.29 mL/min (ref >60.00)
Glucose, Bld: 160 mg/dL — ABNORMAL HIGH (ref 70–99)
Potassium: 3.7 mEq/L (ref 3.5–5.1)
Sodium: 136 mEq/L (ref 135–145)

## 2014-07-18 LAB — CBC WITH DIFFERENTIAL/PLATELET
BASOS PCT: 0.3 % (ref 0.0–3.0)
Basophils Absolute: 0 10*3/uL (ref 0.0–0.1)
EOS PCT: 0.8 % (ref 0.0–5.0)
Eosinophils Absolute: 0 10*3/uL (ref 0.0–0.7)
HEMATOCRIT: 39.8 % (ref 36.0–46.0)
Hemoglobin: 13.2 g/dL (ref 12.0–15.0)
LYMPHS ABS: 1.6 10*3/uL (ref 0.7–4.0)
Lymphocytes Relative: 27.4 % (ref 12.0–46.0)
MCHC: 33.1 g/dL (ref 30.0–36.0)
MCV: 93.1 fl (ref 78.0–100.0)
MONO ABS: 0.3 10*3/uL (ref 0.1–1.0)
Monocytes Relative: 4.9 % (ref 3.0–12.0)
NEUTROS ABS: 3.9 10*3/uL (ref 1.4–7.7)
Neutrophils Relative %: 66.6 % (ref 43.0–77.0)
Platelets: 213 10*3/uL (ref 150.0–400.0)
RBC: 4.28 Mil/uL (ref 3.87–5.11)
RDW: 13.2 % (ref 11.5–15.5)
WBC: 5.8 10*3/uL (ref 4.0–10.5)

## 2014-07-18 LAB — POCT URINALYSIS DIPSTICK
Bilirubin, UA: NEGATIVE
Glucose, UA: NEGATIVE
KETONES UA: NEGATIVE
Leukocytes, UA: NEGATIVE
Nitrite, UA: NEGATIVE
PH UA: 5.5
Protein, UA: NEGATIVE
SPEC GRAV UA: 1.01
Urobilinogen, UA: 0.2

## 2014-07-18 LAB — TSH: TSH: 2.19 u[IU]/mL (ref 0.35–4.50)

## 2014-07-18 MED ORDER — LISINOPRIL 20 MG PO TABS: 20.0000 mg | ORAL_TABLET | Freq: Every day | ORAL | Status: DC

## 2014-07-18 NOTE — Progress Notes (Signed)
   Subjective:    Patient ID: Doris Barton, female    DOB: [birth year], 68 y.o.   MRN: [id number]  HPI Doris Barton is a 68 year old female married nonsmoker who comes in today for reevaluation of hypertension. We recently stopped her beta blocker and switched her to lisinopril 10 mg daily. BP has decreased but not dropped back to normal. No side effects from the ACE inhibitor   Review of Systems    review of systems otherwise negative Objective:   Physical Exam  Well-developed well-nourished female no acute distress vital signs stable she's afebrile BP today 120/78............ home morning blood pressures 145/8590 range      Assessment & Plan:  Hypertension........ increase lisinopril to 20 mg a day monitor BP follow-up as outlined

## 2014-07-18 NOTE — Progress Notes (Signed)
Pre visit review using our clinic review tool, if applicable. No additional management support is needed unless otherwise documented below in the visit note. 

## 2014-07-18 NOTE — Patient Instructions (Signed)
Increase your lisinopril to 20 mg daily in the morning  BP check daily in the morning for 1 month......... BP goal 135/85 or less. If after one month your blood pressure is at goal then continue the 20 mg dose daily if not increase it to 30 mg daily. If you have any questions or problems call and leave a voicemail with Apolonio Schneiders extension [phone]

## 2014-07-19 ENCOUNTER — Telehealth: Payer: Self-pay | Admitting: Family Medicine

## 2014-07-19 NOTE — Telephone Encounter (Signed)
emmi emailed °

## 2014-07-20 DIAGNOSIS — J301 Allergic rhinitis due to pollen: Secondary | ICD-10-CM | POA: Diagnosis not present

## 2014-07-20 DIAGNOSIS — J3081 Allergic rhinitis due to animal (cat) (dog) hair and dander: Secondary | ICD-10-CM | POA: Diagnosis not present

## 2014-07-20 DIAGNOSIS — J3089 Other allergic rhinitis: Secondary | ICD-10-CM | POA: Diagnosis not present

## 2014-07-24 DIAGNOSIS — J3089 Other allergic rhinitis: Secondary | ICD-10-CM | POA: Diagnosis not present

## 2014-07-24 DIAGNOSIS — J301 Allergic rhinitis due to pollen: Secondary | ICD-10-CM | POA: Diagnosis not present

## 2014-07-24 DIAGNOSIS — J3081 Allergic rhinitis due to animal (cat) (dog) hair and dander: Secondary | ICD-10-CM | POA: Diagnosis not present

## 2014-08-03 DIAGNOSIS — J3089 Other allergic rhinitis: Secondary | ICD-10-CM | POA: Diagnosis not present

## 2014-08-03 DIAGNOSIS — J3081 Allergic rhinitis due to animal (cat) (dog) hair and dander: Secondary | ICD-10-CM | POA: Diagnosis not present

## 2014-08-03 DIAGNOSIS — J301 Allergic rhinitis due to pollen: Secondary | ICD-10-CM | POA: Diagnosis not present

## 2014-08-09 DIAGNOSIS — J3089 Other allergic rhinitis: Secondary | ICD-10-CM | POA: Diagnosis not present

## 2014-08-09 DIAGNOSIS — J3081 Allergic rhinitis due to animal (cat) (dog) hair and dander: Secondary | ICD-10-CM | POA: Diagnosis not present

## 2014-08-09 DIAGNOSIS — J301 Allergic rhinitis due to pollen: Secondary | ICD-10-CM | POA: Diagnosis not present

## 2014-08-11 DIAGNOSIS — J301 Allergic rhinitis due to pollen: Secondary | ICD-10-CM | POA: Diagnosis not present

## 2014-08-11 DIAGNOSIS — J3081 Allergic rhinitis due to animal (cat) (dog) hair and dander: Secondary | ICD-10-CM | POA: Diagnosis not present

## 2014-08-11 DIAGNOSIS — J3089 Other allergic rhinitis: Secondary | ICD-10-CM | POA: Diagnosis not present

## 2014-08-16 DIAGNOSIS — J3081 Allergic rhinitis due to animal (cat) (dog) hair and dander: Secondary | ICD-10-CM | POA: Diagnosis not present

## 2014-08-16 DIAGNOSIS — J301 Allergic rhinitis due to pollen: Secondary | ICD-10-CM | POA: Diagnosis not present

## 2014-08-16 DIAGNOSIS — J3089 Other allergic rhinitis: Secondary | ICD-10-CM | POA: Diagnosis not present

## 2014-08-16 DIAGNOSIS — H1045 Other chronic allergic conjunctivitis: Secondary | ICD-10-CM | POA: Diagnosis not present

## 2014-08-18 DIAGNOSIS — J301 Allergic rhinitis due to pollen: Secondary | ICD-10-CM | POA: Diagnosis not present

## 2014-08-18 DIAGNOSIS — J3081 Allergic rhinitis due to animal (cat) (dog) hair and dander: Secondary | ICD-10-CM | POA: Diagnosis not present

## 2014-08-18 DIAGNOSIS — J3089 Other allergic rhinitis: Secondary | ICD-10-CM | POA: Diagnosis not present

## 2014-08-22 DIAGNOSIS — J3089 Other allergic rhinitis: Secondary | ICD-10-CM | POA: Diagnosis not present

## 2014-08-22 DIAGNOSIS — J301 Allergic rhinitis due to pollen: Secondary | ICD-10-CM | POA: Diagnosis not present

## 2014-08-22 DIAGNOSIS — J3081 Allergic rhinitis due to animal (cat) (dog) hair and dander: Secondary | ICD-10-CM | POA: Diagnosis not present

## 2014-08-23 ENCOUNTER — Telehealth: Payer: Self-pay | Admitting: Family Medicine

## 2014-08-23 DIAGNOSIS — I10 Essential (primary) hypertension: Secondary | ICD-10-CM

## 2014-08-23 NOTE — Telephone Encounter (Signed)
Patient calling her average blood pressure readings have been 138/82.  She has been taking 2 lisinopril daily.

## 2014-08-23 NOTE — Telephone Encounter (Signed)
Left message on machine for patient returning her call

## 2014-08-23 NOTE — Telephone Encounter (Signed)
Pt instructed to call dr todd after a month of taking her bp. Would like a cb to discuss.

## 2014-08-24 NOTE — Telephone Encounter (Signed)
Per Dr Sherren Mocha  Patient should keep her medication the same.  She should also come back for a Bmet next week.  Patient is aware and will call back for a lab appointment  BMET ordered.

## 2014-08-29 ENCOUNTER — Other Ambulatory Visit: Payer: Medicare Other

## 2014-08-31 ENCOUNTER — Other Ambulatory Visit (INDEPENDENT_AMBULATORY_CARE_PROVIDER_SITE_OTHER): Payer: Medicare Other

## 2014-08-31 DIAGNOSIS — I10 Essential (primary) hypertension: Secondary | ICD-10-CM

## 2014-08-31 DIAGNOSIS — J3081 Allergic rhinitis due to animal (cat) (dog) hair and dander: Secondary | ICD-10-CM | POA: Diagnosis not present

## 2014-08-31 DIAGNOSIS — J301 Allergic rhinitis due to pollen: Secondary | ICD-10-CM | POA: Diagnosis not present

## 2014-08-31 DIAGNOSIS — J3089 Other allergic rhinitis: Secondary | ICD-10-CM | POA: Diagnosis not present

## 2014-08-31 LAB — BASIC METABOLIC PANEL
BUN: 21 mg/dL (ref 6–23)
CALCIUM: 9.1 mg/dL (ref 8.4–10.5)
CHLORIDE: 104 meq/L (ref 96–112)
CO2: 31 mEq/L (ref 19–32)
Creatinine, Ser: 0.95 mg/dL (ref 0.40–1.20)
GFR: 62.04 mL/min (ref >60.00)
GLUCOSE: 87 mg/dL (ref 70–99)
POTASSIUM: 3.7 meq/L (ref 3.5–5.1)
SODIUM: 141 meq/L (ref 135–145)

## 2014-09-04 DIAGNOSIS — J301 Allergic rhinitis due to pollen: Secondary | ICD-10-CM | POA: Diagnosis not present

## 2014-09-04 DIAGNOSIS — J3089 Other allergic rhinitis: Secondary | ICD-10-CM | POA: Diagnosis not present

## 2014-09-04 DIAGNOSIS — J3081 Allergic rhinitis due to animal (cat) (dog) hair and dander: Secondary | ICD-10-CM | POA: Diagnosis not present

## 2014-09-07 DIAGNOSIS — J301 Allergic rhinitis due to pollen: Secondary | ICD-10-CM | POA: Diagnosis not present

## 2014-09-07 DIAGNOSIS — J3081 Allergic rhinitis due to animal (cat) (dog) hair and dander: Secondary | ICD-10-CM | POA: Diagnosis not present

## 2014-09-07 DIAGNOSIS — J3089 Other allergic rhinitis: Secondary | ICD-10-CM | POA: Diagnosis not present

## 2014-09-11 DIAGNOSIS — J3089 Other allergic rhinitis: Secondary | ICD-10-CM | POA: Diagnosis not present

## 2014-09-11 DIAGNOSIS — J301 Allergic rhinitis due to pollen: Secondary | ICD-10-CM | POA: Diagnosis not present

## 2014-09-11 DIAGNOSIS — J3081 Allergic rhinitis due to animal (cat) (dog) hair and dander: Secondary | ICD-10-CM | POA: Diagnosis not present

## 2014-09-13 DIAGNOSIS — J3081 Allergic rhinitis due to animal (cat) (dog) hair and dander: Secondary | ICD-10-CM | POA: Diagnosis not present

## 2014-09-13 DIAGNOSIS — J3089 Other allergic rhinitis: Secondary | ICD-10-CM | POA: Diagnosis not present

## 2014-09-13 DIAGNOSIS — J301 Allergic rhinitis due to pollen: Secondary | ICD-10-CM | POA: Diagnosis not present

## 2014-09-20 DIAGNOSIS — J3089 Other allergic rhinitis: Secondary | ICD-10-CM | POA: Diagnosis not present

## 2014-09-20 DIAGNOSIS — J3081 Allergic rhinitis due to animal (cat) (dog) hair and dander: Secondary | ICD-10-CM | POA: Diagnosis not present

## 2014-09-20 DIAGNOSIS — J301 Allergic rhinitis due to pollen: Secondary | ICD-10-CM | POA: Diagnosis not present

## 2014-09-22 DIAGNOSIS — J3089 Other allergic rhinitis: Secondary | ICD-10-CM | POA: Diagnosis not present

## 2014-09-22 DIAGNOSIS — J301 Allergic rhinitis due to pollen: Secondary | ICD-10-CM | POA: Diagnosis not present

## 2014-09-22 DIAGNOSIS — J3081 Allergic rhinitis due to animal (cat) (dog) hair and dander: Secondary | ICD-10-CM | POA: Diagnosis not present

## 2014-09-26 DIAGNOSIS — J301 Allergic rhinitis due to pollen: Secondary | ICD-10-CM | POA: Diagnosis not present

## 2014-09-26 DIAGNOSIS — J3081 Allergic rhinitis due to animal (cat) (dog) hair and dander: Secondary | ICD-10-CM | POA: Diagnosis not present

## 2014-09-26 DIAGNOSIS — J3089 Other allergic rhinitis: Secondary | ICD-10-CM | POA: Diagnosis not present

## 2014-09-28 DIAGNOSIS — J3089 Other allergic rhinitis: Secondary | ICD-10-CM | POA: Diagnosis not present

## 2014-09-28 DIAGNOSIS — J301 Allergic rhinitis due to pollen: Secondary | ICD-10-CM | POA: Diagnosis not present

## 2014-09-28 DIAGNOSIS — J3081 Allergic rhinitis due to animal (cat) (dog) hair and dander: Secondary | ICD-10-CM | POA: Diagnosis not present

## 2014-10-03 DIAGNOSIS — J301 Allergic rhinitis due to pollen: Secondary | ICD-10-CM | POA: Diagnosis not present

## 2014-10-03 DIAGNOSIS — J3089 Other allergic rhinitis: Secondary | ICD-10-CM | POA: Diagnosis not present

## 2014-10-03 DIAGNOSIS — J3081 Allergic rhinitis due to animal (cat) (dog) hair and dander: Secondary | ICD-10-CM | POA: Diagnosis not present

## 2014-10-12 ENCOUNTER — Other Ambulatory Visit: Payer: Self-pay

## 2014-10-12 DIAGNOSIS — C3490 Malignant neoplasm of unspecified part of unspecified bronchus or lung: Secondary | ICD-10-CM

## 2014-10-13 DIAGNOSIS — J3089 Other allergic rhinitis: Secondary | ICD-10-CM | POA: Diagnosis not present

## 2014-10-13 DIAGNOSIS — J3081 Allergic rhinitis due to animal (cat) (dog) hair and dander: Secondary | ICD-10-CM | POA: Diagnosis not present

## 2014-10-13 DIAGNOSIS — J301 Allergic rhinitis due to pollen: Secondary | ICD-10-CM | POA: Diagnosis not present

## 2014-10-17 DIAGNOSIS — J3081 Allergic rhinitis due to animal (cat) (dog) hair and dander: Secondary | ICD-10-CM | POA: Diagnosis not present

## 2014-10-17 DIAGNOSIS — J3089 Other allergic rhinitis: Secondary | ICD-10-CM | POA: Diagnosis not present

## 2014-10-17 DIAGNOSIS — J301 Allergic rhinitis due to pollen: Secondary | ICD-10-CM | POA: Diagnosis not present

## 2014-10-19 DIAGNOSIS — J3089 Other allergic rhinitis: Secondary | ICD-10-CM | POA: Diagnosis not present

## 2014-10-19 DIAGNOSIS — J3081 Allergic rhinitis due to animal (cat) (dog) hair and dander: Secondary | ICD-10-CM | POA: Diagnosis not present

## 2014-10-19 DIAGNOSIS — J301 Allergic rhinitis due to pollen: Secondary | ICD-10-CM | POA: Diagnosis not present

## 2014-10-24 DIAGNOSIS — J3081 Allergic rhinitis due to animal (cat) (dog) hair and dander: Secondary | ICD-10-CM | POA: Diagnosis not present

## 2014-10-24 DIAGNOSIS — J301 Allergic rhinitis due to pollen: Secondary | ICD-10-CM | POA: Diagnosis not present

## 2014-10-24 DIAGNOSIS — J3089 Other allergic rhinitis: Secondary | ICD-10-CM | POA: Diagnosis not present

## 2014-10-26 DIAGNOSIS — J3081 Allergic rhinitis due to animal (cat) (dog) hair and dander: Secondary | ICD-10-CM | POA: Diagnosis not present

## 2014-10-26 DIAGNOSIS — J3089 Other allergic rhinitis: Secondary | ICD-10-CM | POA: Diagnosis not present

## 2014-10-26 DIAGNOSIS — J301 Allergic rhinitis due to pollen: Secondary | ICD-10-CM | POA: Diagnosis not present

## 2014-10-30 DIAGNOSIS — J3081 Allergic rhinitis due to animal (cat) (dog) hair and dander: Secondary | ICD-10-CM | POA: Diagnosis not present

## 2014-10-30 DIAGNOSIS — J301 Allergic rhinitis due to pollen: Secondary | ICD-10-CM | POA: Diagnosis not present

## 2014-10-30 DIAGNOSIS — J3089 Other allergic rhinitis: Secondary | ICD-10-CM | POA: Diagnosis not present

## 2014-11-13 DIAGNOSIS — J3089 Other allergic rhinitis: Secondary | ICD-10-CM | POA: Diagnosis not present

## 2014-11-13 DIAGNOSIS — J3081 Allergic rhinitis due to animal (cat) (dog) hair and dander: Secondary | ICD-10-CM | POA: Diagnosis not present

## 2014-11-13 DIAGNOSIS — J301 Allergic rhinitis due to pollen: Secondary | ICD-10-CM | POA: Diagnosis not present

## 2014-11-24 DIAGNOSIS — Z1231 Encounter for screening mammogram for malignant neoplasm of breast: Secondary | ICD-10-CM | POA: Diagnosis not present

## 2014-11-24 LAB — HM MAMMOGRAPHY: HM Mammogram: NORMAL

## 2014-11-27 ENCOUNTER — Encounter: Payer: Self-pay | Admitting: Family Medicine

## 2014-11-27 ENCOUNTER — Other Ambulatory Visit: Payer: Self-pay | Admitting: Thoracic Surgery (Cardiothoracic Vascular Surgery)

## 2014-11-27 DIAGNOSIS — J3081 Allergic rhinitis due to animal (cat) (dog) hair and dander: Secondary | ICD-10-CM | POA: Diagnosis not present

## 2014-11-27 DIAGNOSIS — J301 Allergic rhinitis due to pollen: Secondary | ICD-10-CM | POA: Diagnosis not present

## 2014-11-27 DIAGNOSIS — J3089 Other allergic rhinitis: Secondary | ICD-10-CM | POA: Diagnosis not present

## 2014-11-28 ENCOUNTER — Ambulatory Visit
Admission: RE | Admit: 2014-11-28 | Discharge: 2014-11-28 | Disposition: A | Discharge status: Home / Self Care | Payer: Medicare Other | Source: Ambulatory Visit | Attending: Thoracic Surgery (Cardiothoracic Vascular Surgery) | Admitting: Thoracic Surgery (Cardiothoracic Vascular Surgery)

## 2014-11-28 ENCOUNTER — Encounter: Payer: Self-pay | Admitting: Thoracic Surgery (Cardiothoracic Vascular Surgery)

## 2014-11-28 ENCOUNTER — Ambulatory Visit (INDEPENDENT_AMBULATORY_CARE_PROVIDER_SITE_OTHER): Payer: Medicare Other | Admitting: Thoracic Surgery (Cardiothoracic Vascular Surgery)

## 2014-11-28 VITALS — BP 146/85 | HR 71 | Resp 20 | Ht 62.0 in | Wt 130.0 lb

## 2014-11-28 DIAGNOSIS — Z85118 Personal history of other malignant neoplasm of bronchus and lung: Secondary | ICD-10-CM | POA: Diagnosis not present

## 2014-11-28 DIAGNOSIS — C3432 Malignant neoplasm of lower lobe, left bronchus or lung: Secondary | ICD-10-CM

## 2014-11-28 DIAGNOSIS — R911 Solitary pulmonary nodule: Secondary | ICD-10-CM | POA: Diagnosis not present

## 2014-11-28 DIAGNOSIS — C3490 Malignant neoplasm of unspecified part of unspecified bronchus or lung: Secondary | ICD-10-CM

## 2014-11-28 NOTE — Progress Notes (Signed)
[address]       [place] [address]             [phone]       HPI: Doris Barton returns today for a scheduled follow-up visit.  She is a 69 year old nonsmoker who had a groundglass opacity in the right lower lobe. We followed that for a couple of years and it was stable. But then at 3 years it had increased in size slightly, so we went ahead and took it out. I did a wedge resection on her to turned out to be a stage IA non-small cell lung cancer. There was a very small focus of invasive cancer in the background of adenocarcinoma with lepidic spread. The surgery was back in September 2014.  She was last seen in the office in October 2015 for one year follow-up and had no evidence recurrent disease at that time. There was scarring in the area of her wedge resection and there also was some linear scarring in the lingula adjacent to her incision.  In the interim since her last visit she says she's been doing well. She has not had any medical issues. Her weight is stable. She denies any problems with her breathing. She has not had any respiratory infections. She remains very active.  Past Medical History  Diagnosis Date  . PONV (postoperative nausea and vomiting)   . Hypertension   . Asthma     last flare 6 mos to year ago  . Blood in urine 3 years ago  . Headache(784.0)     migraines none in last 10-15 years ago  . Arthritis   . Carcinoma, lung     Invasive adenocarcinoma status post LLL wedge resection with negative margin      Current Outpatient Prescriptions  Medication Sig Dispense Refill  . EPIPEN 2-PAK 0.3 MG/0.3ML DEVI Inject 0.3 mg into the skin once.     Marland Kitchen estradiol (ESTRACE VAGINAL) 0.1 MG/GM vaginal cream Apply small amounts twice weekly 42.5 g 11  . lisinopril (PRINIVIL,ZESTRIL) 20 MG tablet Take 1 tablet (20 mg total) by mouth daily. 90 tablet 3   No current facility-administered medications for this visit.    Physical Exam: BP 146/85  mmHg  Pulse 71  Resp 20  Ht '5\' 2"'$  (1.575 m)  Wt 130 lb (58.968 kg)  BMI 23.77 kg/m2  SpO94 38% 69 year old woman in no acute distress Alert and oriented 3 with no focal neurologic deficits No cervical or suprapubic or adenopathy Cardiac regular rate and rhythm normal S1 and S2 Lungs clear with equal vessels bilaterally Incisions well healed   Diagnostic Tests: CT of the chest reviewed and compared to the film from 05/23/2014. There is scarring in the area of her previous wedge resection which is unchanged. There is linear nodularity in the lingula that appears slightly increased in size relative to her scan from October.  Impression: 69 year old woman with a history of a stage 1A non-small cell adenocarcinoma of the lung resected 18 months ago.   She has no evidence of recurrent disease.  There is some nodularity in the lingula that appears more prominent than it did on her scan from October of last year. Not sure what to make of this. It's very linear and would be an unusual appearance for a new primary. It certainly does not appear to be a metastatic lesion. It's too small for PET to be useful at this point. I think the best thing  to do at this point is to just continue to follow-up with CT scans.  Her blood pressure was elevated at 146/85. She has recently changed her blood pressure medication the lisinopril 20 mg daily. She has a blood pressure cuff at home that she uses to check her husband's pressure. I recommended that she check herself a couple times a day for about a week and see if she is getting persistently high levels of so her medications main need to be adjusted.  Plan:  Return in 6 months with CT of chest for 2 year follow-up visit   Melrose Nakayama, MD [facility] [phone]

## 2014-12-11 DIAGNOSIS — J301 Allergic rhinitis due to pollen: Secondary | ICD-10-CM | POA: Diagnosis not present

## 2014-12-11 DIAGNOSIS — J3081 Allergic rhinitis due to animal (cat) (dog) hair and dander: Secondary | ICD-10-CM | POA: Diagnosis not present

## 2014-12-11 DIAGNOSIS — J3089 Other allergic rhinitis: Secondary | ICD-10-CM | POA: Diagnosis not present

## 2014-12-13 ENCOUNTER — Encounter: Payer: Self-pay | Admitting: *Deleted

## 2014-12-13 DIAGNOSIS — Z23 Encounter for immunization: Secondary | ICD-10-CM | POA: Diagnosis not present

## 2014-12-25 DIAGNOSIS — J301 Allergic rhinitis due to pollen: Secondary | ICD-10-CM | POA: Diagnosis not present

## 2014-12-25 DIAGNOSIS — J3089 Other allergic rhinitis: Secondary | ICD-10-CM | POA: Diagnosis not present

## 2014-12-25 DIAGNOSIS — J3081 Allergic rhinitis due to animal (cat) (dog) hair and dander: Secondary | ICD-10-CM | POA: Diagnosis not present

## 2015-01-02 DIAGNOSIS — J3089 Other allergic rhinitis: Secondary | ICD-10-CM | POA: Diagnosis not present

## 2015-01-02 DIAGNOSIS — J3081 Allergic rhinitis due to animal (cat) (dog) hair and dander: Secondary | ICD-10-CM | POA: Diagnosis not present

## 2015-01-02 DIAGNOSIS — J301 Allergic rhinitis due to pollen: Secondary | ICD-10-CM | POA: Diagnosis not present

## 2015-01-08 DIAGNOSIS — J3089 Other allergic rhinitis: Secondary | ICD-10-CM | POA: Diagnosis not present

## 2015-01-08 DIAGNOSIS — J301 Allergic rhinitis due to pollen: Secondary | ICD-10-CM | POA: Diagnosis not present

## 2015-01-08 DIAGNOSIS — J3081 Allergic rhinitis due to animal (cat) (dog) hair and dander: Secondary | ICD-10-CM | POA: Diagnosis not present

## 2015-01-16 DIAGNOSIS — J3081 Allergic rhinitis due to animal (cat) (dog) hair and dander: Secondary | ICD-10-CM | POA: Diagnosis not present

## 2015-01-16 DIAGNOSIS — J3089 Other allergic rhinitis: Secondary | ICD-10-CM | POA: Diagnosis not present

## 2015-01-16 DIAGNOSIS — J301 Allergic rhinitis due to pollen: Secondary | ICD-10-CM | POA: Diagnosis not present

## 2015-01-25 DIAGNOSIS — J3081 Allergic rhinitis due to animal (cat) (dog) hair and dander: Secondary | ICD-10-CM | POA: Diagnosis not present

## 2015-01-25 DIAGNOSIS — J3089 Other allergic rhinitis: Secondary | ICD-10-CM | POA: Diagnosis not present

## 2015-01-25 DIAGNOSIS — J301 Allergic rhinitis due to pollen: Secondary | ICD-10-CM | POA: Diagnosis not present

## 2015-01-29 DIAGNOSIS — J301 Allergic rhinitis due to pollen: Secondary | ICD-10-CM | POA: Diagnosis not present

## 2015-01-31 DIAGNOSIS — J301 Allergic rhinitis due to pollen: Secondary | ICD-10-CM | POA: Diagnosis not present

## 2015-01-31 DIAGNOSIS — J3081 Allergic rhinitis due to animal (cat) (dog) hair and dander: Secondary | ICD-10-CM | POA: Diagnosis not present

## 2015-01-31 DIAGNOSIS — J3089 Other allergic rhinitis: Secondary | ICD-10-CM | POA: Diagnosis not present

## 2015-02-12 DIAGNOSIS — J3089 Other allergic rhinitis: Secondary | ICD-10-CM | POA: Diagnosis not present

## 2015-02-12 DIAGNOSIS — J301 Allergic rhinitis due to pollen: Secondary | ICD-10-CM | POA: Diagnosis not present

## 2015-02-16 DIAGNOSIS — J3081 Allergic rhinitis due to animal (cat) (dog) hair and dander: Secondary | ICD-10-CM | POA: Diagnosis not present

## 2015-02-16 DIAGNOSIS — J301 Allergic rhinitis due to pollen: Secondary | ICD-10-CM | POA: Diagnosis not present

## 2015-02-16 DIAGNOSIS — J3089 Other allergic rhinitis: Secondary | ICD-10-CM | POA: Diagnosis not present

## 2015-02-22 DIAGNOSIS — J301 Allergic rhinitis due to pollen: Secondary | ICD-10-CM | POA: Diagnosis not present

## 2015-02-22 DIAGNOSIS — J3089 Other allergic rhinitis: Secondary | ICD-10-CM | POA: Diagnosis not present

## 2015-03-02 DIAGNOSIS — J301 Allergic rhinitis due to pollen: Secondary | ICD-10-CM | POA: Diagnosis not present

## 2015-03-02 DIAGNOSIS — J3089 Other allergic rhinitis: Secondary | ICD-10-CM | POA: Diagnosis not present

## 2015-03-02 DIAGNOSIS — J3081 Allergic rhinitis due to animal (cat) (dog) hair and dander: Secondary | ICD-10-CM | POA: Diagnosis not present

## 2015-03-07 ENCOUNTER — Ambulatory Visit (INDEPENDENT_AMBULATORY_CARE_PROVIDER_SITE_OTHER): Payer: Medicare Other | Admitting: Adult Health

## 2015-03-07 ENCOUNTER — Encounter: Payer: Self-pay | Admitting: Adult Health

## 2015-03-07 ENCOUNTER — Ambulatory Visit (INDEPENDENT_AMBULATORY_CARE_PROVIDER_SITE_OTHER)
Admission: RE | Admit: 2015-03-07 | Discharge: 2015-03-07 | Disposition: A | Discharge status: Home / Self Care | Payer: Medicare Other | Source: Ambulatory Visit | Attending: Adult Health | Admitting: Adult Health

## 2015-03-07 VITALS — BP 160/100 | Temp 98.3°F | Ht 62.0 in | Wt 127.9 lb

## 2015-03-07 DIAGNOSIS — G43009 Migraine without aura, not intractable, without status migrainosus: Secondary | ICD-10-CM

## 2015-03-07 DIAGNOSIS — R4182 Altered mental status, unspecified: Secondary | ICD-10-CM | POA: Diagnosis not present

## 2015-03-07 DIAGNOSIS — G43909 Migraine, unspecified, not intractable, without status migrainosus: Secondary | ICD-10-CM | POA: Diagnosis not present

## 2015-03-07 LAB — CBC WITH DIFFERENTIAL/PLATELET
Basophils Absolute: 0 10*3/uL (ref 0.0–0.1)
Basophils Relative: 0.4 % (ref 0.0–3.0)
Eosinophils Absolute: 0 10*3/uL (ref 0.0–0.7)
Eosinophils Relative: 0.5 % (ref 0.0–5.0)
HEMATOCRIT: 44.3 % (ref 36.0–46.0)
Hemoglobin: 15 g/dL (ref 12.0–15.0)
Lymphocytes Relative: 19.9 % (ref 12.0–46.0)
Lymphs Abs: 1.3 10*3/uL (ref 0.7–4.0)
MCHC: 33.8 g/dL (ref 30.0–36.0)
MCV: 91.6 fl (ref 78.0–100.0)
MONOS PCT: 4.8 % (ref 3.0–12.0)
Monocytes Absolute: 0.3 10*3/uL (ref 0.1–1.0)
Neutro Abs: 4.8 10*3/uL (ref 1.4–7.7)
Neutrophils Relative %: 74.4 % (ref 43.0–77.0)
PLATELETS: 209 10*3/uL (ref 150.0–400.0)
RBC: 4.84 Mil/uL (ref 3.87–5.11)
RDW: 13.9 % (ref 11.5–15.5)
WBC: 6.4 10*3/uL (ref 4.0–10.5)

## 2015-03-07 LAB — BASIC METABOLIC PANEL
BUN: 14 mg/dL (ref 6–23)
CALCIUM: 9.8 mg/dL (ref 8.4–10.5)
CHLORIDE: 104 meq/L (ref 96–112)
CO2: 30 mEq/L (ref 19–32)
CREATININE: 0.8 mg/dL (ref 0.40–1.20)
GFR: 75.53 mL/min (ref >60.00)
GLUCOSE: 96 mg/dL (ref 70–99)
Potassium: 5 mEq/L (ref 3.5–5.1)
SODIUM: 141 meq/L (ref 135–145)

## 2015-03-07 NOTE — Patient Instructions (Signed)
I will follow up with you regarding your labs and imagining.   If you continue to have this sensation please let me know.   Call if you need anything additional.   It was great meeting you!

## 2015-03-07 NOTE — Progress Notes (Signed)
Subjective:    Patient ID: Doris Barton, female    DOB: 02/10/1946, 69 y.o.   MRN: [id number]  HPI 69 year old female who presents to the office with vague complaint of a " weird pulling sensation of my head to the right." The first time she felt like this was two weeks ago while standing at the kitchen counter making her grocery list. She had this sensation and states " it almost made me pass out and I felt like I was going to fall to the right." She sat down and the feeling went away. The second time this happened was yesterday while in her car at the grocery store. She was driving up to the gas lane and she started to feel again " like my head was pulling to the right." This again, lasted a few seconds and then she had a migraine headache. This time she endorses " I saw some psychodelic stuff, like spots in my vision,"   She has a history of vertigo and states " it does not feel like vertigo. I am not dizzy or lightheaded. I just have that weird sensation."   Has had migraines in the past, twenty years ago, since that time she would have sporadic migraines. This summer she started having more migraines with " blind spots in my field of vision."   Denies slurred speech, facial droop, or tremors. She currently has a headache but it is not migraine like.   Review of Systems  Constitutional: Negative.   HENT: Negative for congestion, ear discharge, ear pain, hearing loss and tinnitus.   Eyes: Positive for visual disturbance. Negative for photophobia, pain, discharge, redness and itching.  Respiratory: Negative.   Cardiovascular: Negative.   Neurological: Positive for headaches. Negative for dizziness, tremors, seizures, syncope, facial asymmetry, speech difficulty, weakness, light-headedness and numbness.  Psychiatric/Behavioral: Negative.    Past Medical History  Diagnosis Date  . PONV (postoperative nausea and vomiting)   . Hypertension   . Asthma     last flare 6 mos to year ago  .  Blood in urine 3 years ago  . Headache(784.0)     migraines none in last 10-15 years ago  . Arthritis   . Carcinoma, lung     Invasive adenocarcinoma status post LLL wedge resection with negative margin    History   Social History  . Marital Status: Married    Spouse Name: N/A  . Number of Children: N/A  . Years of Education: N/A   Occupational History  . Not on file.   Social History Main Topics  . Smoking status: Never Smoker   . Smokeless tobacco: Not on file  . Alcohol Use: No  . Drug Use: No  . Sexual Activity: Not on file   Other Topics Concern  . Not on file   Social History Narrative    Past Surgical History  Procedure Laterality Date  . Dilation and curettage of uterus    . Lymph node biopsy  2011  . Tubal ligation    . Tonsillectomy    . Colonoscopy    . Video assisted thoracoscopy (vats)/wedge resection Left 04/25/2013    Procedure: VIDEO ASSISTED THORACOSCOPY, LEFT LOWER LOBE WEDGE RESECTION AND LYMPH NODE SAMPLING;  Surgeon: Melrose Nakayama, MD;  Location: [facility];  Service: Thoracic;  Laterality: Left;    No family history on file.  No Known Allergies  Current Outpatient Prescriptions on File Prior to Visit  Medication Sig Dispense Refill  .  EPIPEN 2-PAK 0.3 MG/0.3ML DEVI Inject 0.3 mg into the skin once.     Marland Kitchen estradiol (ESTRACE VAGINAL) 0.1 MG/GM vaginal cream Apply small amounts twice weekly 42.5 g 11  . lisinopril (PRINIVIL,ZESTRIL) 20 MG tablet Take 1 tablet (20 mg total) by mouth daily. 90 tablet 3   No current facility-administered medications on file prior to visit.    BP 160/100 mmHg  Temp(Src) 98.3 F (36.8 C) (Oral)  Ht '5\' 2"'$  (1.575 m)  Wt 127 lb 14.4 oz (58.015 kg)  BMI 23.39 kg/m2       Objective:   Physical Exam  Constitutional: She is oriented to person, place, and time. She appears well-developed and well-nourished. No distress.  HENT:  Head: Normocephalic and atraumatic.  Right Ear: External ear normal.  Left  Ear: External ear normal.  Nose: Nose normal.  Mouth/Throat: Oropharynx is clear and moist. No oropharyngeal exudate.  TM's visualized. No signs of infection or effusion.   Neck: Normal range of motion. Neck supple.  Cardiovascular: Normal rate, regular rhythm, normal heart sounds and intact distal pulses.  Exam reveals no gallop and no friction rub.   No murmur heard. Pulmonary/Chest: Effort normal and breath sounds normal. No respiratory distress. She has no wheezes. She has no rales. She exhibits no tenderness.  Musculoskeletal: Normal range of motion. She exhibits no edema or tenderness.  Lymphadenopathy:    She has no cervical adenopathy.  Neurological: She is alert and oriented to person, place, and time. She has normal reflexes. No cranial nerve deficit. Coordination normal.  Skin: Skin is warm and dry. No rash noted. She is not diaphoretic. No erythema. No pallor.  Psychiatric: She has a normal mood and affect. Her behavior is normal. Judgment and thought content normal.  Nursing note and vitals reviewed.      Assessment & Plan:  1. Migraine without aura and without status migrainosus, not intractable - Unsure of etiology of her complaints. Likely presentation of migraine. Less likely intercranial mass or hemorrhage.  - CT Head Wo Contrast; Future - Consider refferal to neurology - Basic metabolic panel - CBC with Differential/Platelet - Does not want any medication for migraines at this time.

## 2015-03-07 NOTE — Progress Notes (Signed)
Pre visit review using our clinic review tool, if applicable. No additional management support is needed unless otherwise documented below in the visit note. 

## 2015-03-09 DIAGNOSIS — J3089 Other allergic rhinitis: Secondary | ICD-10-CM | POA: Diagnosis not present

## 2015-03-09 DIAGNOSIS — J301 Allergic rhinitis due to pollen: Secondary | ICD-10-CM | POA: Diagnosis not present

## 2015-03-23 DIAGNOSIS — J301 Allergic rhinitis due to pollen: Secondary | ICD-10-CM | POA: Diagnosis not present

## 2015-03-23 DIAGNOSIS — J3089 Other allergic rhinitis: Secondary | ICD-10-CM | POA: Diagnosis not present

## 2015-03-23 DIAGNOSIS — J3081 Allergic rhinitis due to animal (cat) (dog) hair and dander: Secondary | ICD-10-CM | POA: Diagnosis not present

## 2015-04-06 DIAGNOSIS — J3081 Allergic rhinitis due to animal (cat) (dog) hair and dander: Secondary | ICD-10-CM | POA: Diagnosis not present

## 2015-04-06 DIAGNOSIS — J301 Allergic rhinitis due to pollen: Secondary | ICD-10-CM | POA: Diagnosis not present

## 2015-04-06 DIAGNOSIS — J3089 Other allergic rhinitis: Secondary | ICD-10-CM | POA: Diagnosis not present

## 2015-04-18 ENCOUNTER — Telehealth: Payer: Self-pay | Admitting: Family Medicine

## 2015-04-18 DIAGNOSIS — J3089 Other allergic rhinitis: Secondary | ICD-10-CM | POA: Diagnosis not present

## 2015-04-18 DIAGNOSIS — J3081 Allergic rhinitis due to animal (cat) (dog) hair and dander: Secondary | ICD-10-CM | POA: Diagnosis not present

## 2015-04-18 DIAGNOSIS — J301 Allergic rhinitis due to pollen: Secondary | ICD-10-CM | POA: Diagnosis not present

## 2015-04-18 NOTE — Telephone Encounter (Signed)
Pt states her blood pressure medicine is making her cough and is keeping her up at night.  She said Dr. Sherren Mocha told her if that happened he would change it for her.  She wants to try to get a generic that don't cost too much.

## 2015-04-19 NOTE — Telephone Encounter (Signed)
Per Dr Sherren Mocha patient can try cozaar 50 mg #90 with 3 refills.  The cough could last up to 1-2 months after starting the new medication. Left message on machine for patient to return our call.

## 2015-04-24 DIAGNOSIS — Z23 Encounter for immunization: Secondary | ICD-10-CM | POA: Diagnosis not present

## 2015-04-24 MED ORDER — LOSARTAN POTASSIUM 50 MG PO TABS
50.0000 mg | ORAL_TABLET | Freq: Every day | ORAL | Status: DC
Start: 2015-04-24 — End: 2018-08-03

## 2015-04-24 NOTE — Telephone Encounter (Signed)
Patient is aware and Rx sent

## 2015-04-24 NOTE — Addendum Note (Signed)
Addended by: Westley Hummer B on: 04/24/2015 09:08 AM   Modules accepted: Orders, Medications

## 2015-05-02 DIAGNOSIS — J3089 Other allergic rhinitis: Secondary | ICD-10-CM | POA: Diagnosis not present

## 2015-05-02 DIAGNOSIS — J3081 Allergic rhinitis due to animal (cat) (dog) hair and dander: Secondary | ICD-10-CM | POA: Diagnosis not present

## 2015-05-02 DIAGNOSIS — J301 Allergic rhinitis due to pollen: Secondary | ICD-10-CM | POA: Diagnosis not present

## 2015-05-11 ENCOUNTER — Other Ambulatory Visit: Payer: Self-pay | Admitting: Thoracic Surgery (Cardiothoracic Vascular Surgery)

## 2015-05-11 DIAGNOSIS — R911 Solitary pulmonary nodule: Secondary | ICD-10-CM

## 2015-05-11 DIAGNOSIS — C3430 Malignant neoplasm of lower lobe, unspecified bronchus or lung: Secondary | ICD-10-CM

## 2015-05-16 DIAGNOSIS — J301 Allergic rhinitis due to pollen: Secondary | ICD-10-CM | POA: Diagnosis not present

## 2015-05-16 DIAGNOSIS — J3081 Allergic rhinitis due to animal (cat) (dog) hair and dander: Secondary | ICD-10-CM | POA: Diagnosis not present

## 2015-05-16 DIAGNOSIS — J3089 Other allergic rhinitis: Secondary | ICD-10-CM | POA: Diagnosis not present

## 2015-05-29 ENCOUNTER — Ambulatory Visit: Payer: Medicare Other | Admitting: Thoracic Surgery (Cardiothoracic Vascular Surgery)

## 2015-05-29 DIAGNOSIS — J3089 Other allergic rhinitis: Secondary | ICD-10-CM | POA: Diagnosis not present

## 2015-05-29 DIAGNOSIS — J301 Allergic rhinitis due to pollen: Secondary | ICD-10-CM | POA: Diagnosis not present

## 2015-06-05 ENCOUNTER — Encounter: Payer: Self-pay | Admitting: Thoracic Surgery (Cardiothoracic Vascular Surgery)

## 2015-06-05 ENCOUNTER — Ambulatory Visit
Admission: RE | Admit: 2015-06-05 | Discharge: 2015-06-05 | Disposition: A | Discharge status: Home / Self Care | Payer: Medicare Other | Source: Ambulatory Visit | Attending: Thoracic Surgery (Cardiothoracic Vascular Surgery) | Admitting: Thoracic Surgery (Cardiothoracic Vascular Surgery)

## 2015-06-05 ENCOUNTER — Ambulatory Visit (INDEPENDENT_AMBULATORY_CARE_PROVIDER_SITE_OTHER): Payer: Medicare Other | Admitting: Thoracic Surgery (Cardiothoracic Vascular Surgery)

## 2015-06-05 VITALS — BP 148/80 | HR 69 | Resp 16 | Ht 62.0 in | Wt 127.0 lb

## 2015-06-05 DIAGNOSIS — C3432 Malignant neoplasm of lower lobe, left bronchus or lung: Secondary | ICD-10-CM

## 2015-06-05 DIAGNOSIS — R911 Solitary pulmonary nodule: Secondary | ICD-10-CM

## 2015-06-05 DIAGNOSIS — Z09 Encounter for follow-up examination after completed treatment for conditions other than malignant neoplasm: Secondary | ICD-10-CM | POA: Diagnosis not present

## 2015-06-05 DIAGNOSIS — R918 Other nonspecific abnormal finding of lung field: Secondary | ICD-10-CM | POA: Diagnosis not present

## 2015-06-05 NOTE — Progress Notes (Signed)
[address]       Doris Barton,[place] [address]             [phone]      HPI: [name] returns today for a scheduled follow-up visit.  She is a 68 year old nonsmoker who had a groundglass opacity in the left lower lobe. We followed that for a couple of years and it was stable. But then at 3 years it had increased in size slightly, so we went ahead and took it out.   I did a wedge resection on her in September 2014. 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 There was a small focus of invasive carcinoma. She was stage IA.  I last saw her in the office in April. She was doing well at that time with no evidence recurrent disease.  She is feeling well. Her appetite is good. She has not had any weight loss. She's not having any chest pain or shortness of breath. She has not had any unusual headaches or visual changes. She is a lifelong nonsmoker. She has not had any other significant health issues since her last visit.  Past Medical History  Diagnosis Date  . PONV (postoperative nausea and vomiting)   . Hypertension   . Asthma     last flare 6 mos to year ago  . Blood in urine 3 years ago  . Headache(784.0)     migraines none in last 10-15 years ago  . Arthritis   . Carcinoma, lung ([place])     Invasive adenocarcinoma status post LLL wedge resection with negative margin     Current Outpatient Prescriptions  Medication Sig Dispense Refill  . EPIPEN 2-PAK 0.3 MG/0.3ML DEVI Inject 0.3 mg into the skin once.     Marland Kitchen estradiol (ESTRACE VAGINAL) 0.1 MG/GM vaginal cream Apply small amounts twice weekly 42.5 g 11  . losartan (COZAAR) 50 MG tablet Take 1 tablet (50 mg total) by mouth daily. 90 tablet 3   No current facility-administered medications for this visit.    Physical Exam BP 148/80 mmHg  Pulse 69  Resp 16  Ht '5\' 2"'$  (1.575 m)  Wt 127 lb (57.607 kg)  BMI 23.22 kg/m2  SpO63 63% 69 year old woman in no acute distress Well-developed and  well-nourished Alert and oriented 3 with no focal deficits No cervical or suprapubic or adenopathy Lungs clear with equal breath sounds bilaterally Incisions well healed Cardiac regular rate and rhythm normal S1 and S2  Diagnostic Tests: CT CHEST WITHOUT CONTRAST  TECHNIQUE: Multidetector CT imaging of the chest was performed following the standard protocol without IV contrast.  COMPARISON: Chest CT 11/28/2014.  FINDINGS: Mediastinum/Lymph Nodes: Heart size is normal. There is no significant pericardial fluid, thickening or pericardial calcification. No pathologically enlarged mediastinal or hilar lymph nodes. Please note that accurate exclusion of hilar adenopathy is limited on noncontrast CT scans. Surgical clips in the region of the AP window, similar to prior examinations. Surgical clips are also noted near the thoracic inlet anterior to the innominate artery origin. Esophagus is unremarkable in appearance. No axillary lymphadenopathy.  Lungs/Pleura: Postoperative changes of wedge resection are noted in the left lower lobe. Again noted is a pleural-based area of pleuroparenchymal thickening which is less nodular in appearance in the periphery of the inferior segment of the lingula, favored to represent an area of chronic scarring. No suspicious appearing pulmonary nodules or masses are noted on today's examination. No acute consolidative airspace disease.  No pleural effusions.  Upper Abdomen: Well-defined low-attenuation lesion in segment 8 of the liver measuring 9 mm is similar to prior examinations, but incompletely characterized on today's noncontrast CT examination, likely a small cyst.  Musculoskeletal/Soft Tissues: There are no aggressive appearing lytic or blastic lesions noted in the visualized portions of the skeleton.  IMPRESSION: 1. Status post wedge resection in the left lower lobe, without evidence to suggest local recurrence of disease or  metastatic disease in the thorax. 2. Mild chronic pleuroparenchymal thickening in the periphery of the inferior segment of the lingula is similar to remote prior studies and less nodular in appearance than the most recent prior examination, most compatible with an area of benign scarring.   Electronically Signed  By: Vinnie Langton M.D.  On: 06/05/2015 14:41 I personally reviewed the CT chest and compared to her old films. I concur with the findings as noted in the official radiology report  Impression: Doris Barton is a 69 year old woman who is 2 years out from a wedge resection for an adenocarcinoma with lepidic spread with a very small invasive component. She is stage IA.  She is doing very well at this time with no evidence recurrent disease.  She is a lifelong nonsmoker.  At this point she is 2 years out so we can go to annual CT follow-up out to 5 years.  Plan:  Return in one year with CT chest  I spent 10 minutes face-to-face with [name] during this visit, greater than 50% spent counseling. Melrose Nakayama, MD [facility] [phone]

## 2015-06-13 DIAGNOSIS — J3089 Other allergic rhinitis: Secondary | ICD-10-CM | POA: Diagnosis not present

## 2015-06-13 DIAGNOSIS — J3081 Allergic rhinitis due to animal (cat) (dog) hair and dander: Secondary | ICD-10-CM | POA: Diagnosis not present

## 2015-06-13 DIAGNOSIS — J301 Allergic rhinitis due to pollen: Secondary | ICD-10-CM | POA: Diagnosis not present

## 2015-06-26 DIAGNOSIS — J3081 Allergic rhinitis due to animal (cat) (dog) hair and dander: Secondary | ICD-10-CM | POA: Diagnosis not present

## 2015-06-26 DIAGNOSIS — J3089 Other allergic rhinitis: Secondary | ICD-10-CM | POA: Diagnosis not present

## 2015-06-26 DIAGNOSIS — J301 Allergic rhinitis due to pollen: Secondary | ICD-10-CM | POA: Diagnosis not present

## 2015-07-10 DIAGNOSIS — J3089 Other allergic rhinitis: Secondary | ICD-10-CM | POA: Diagnosis not present

## 2015-07-10 DIAGNOSIS — J301 Allergic rhinitis due to pollen: Secondary | ICD-10-CM | POA: Diagnosis not present

## 2015-07-10 DIAGNOSIS — J3081 Allergic rhinitis due to animal (cat) (dog) hair and dander: Secondary | ICD-10-CM | POA: Diagnosis not present

## 2015-07-27 DIAGNOSIS — J301 Allergic rhinitis due to pollen: Secondary | ICD-10-CM | POA: Diagnosis not present

## 2015-07-27 DIAGNOSIS — J3081 Allergic rhinitis due to animal (cat) (dog) hair and dander: Secondary | ICD-10-CM | POA: Diagnosis not present

## 2015-07-27 DIAGNOSIS — J3089 Other allergic rhinitis: Secondary | ICD-10-CM | POA: Diagnosis not present

## 2015-08-01 ENCOUNTER — Encounter: Payer: Self-pay | Admitting: *Deleted

## 2015-08-10 DIAGNOSIS — J3081 Allergic rhinitis due to animal (cat) (dog) hair and dander: Secondary | ICD-10-CM | POA: Diagnosis not present

## 2015-08-10 DIAGNOSIS — J3089 Other allergic rhinitis: Secondary | ICD-10-CM | POA: Diagnosis not present

## 2015-08-10 DIAGNOSIS — J301 Allergic rhinitis due to pollen: Secondary | ICD-10-CM | POA: Diagnosis not present

## 2015-08-16 DIAGNOSIS — J3089 Other allergic rhinitis: Secondary | ICD-10-CM | POA: Diagnosis not present

## 2015-08-16 DIAGNOSIS — J301 Allergic rhinitis due to pollen: Secondary | ICD-10-CM | POA: Diagnosis not present

## 2015-08-16 DIAGNOSIS — H1045 Other chronic allergic conjunctivitis: Secondary | ICD-10-CM | POA: Diagnosis not present

## 2015-08-16 DIAGNOSIS — J3081 Allergic rhinitis due to animal (cat) (dog) hair and dander: Secondary | ICD-10-CM | POA: Diagnosis not present

## 2015-08-28 DIAGNOSIS — J301 Allergic rhinitis due to pollen: Secondary | ICD-10-CM | POA: Diagnosis not present

## 2015-08-28 DIAGNOSIS — J3081 Allergic rhinitis due to animal (cat) (dog) hair and dander: Secondary | ICD-10-CM | POA: Diagnosis not present

## 2015-08-28 DIAGNOSIS — J3089 Other allergic rhinitis: Secondary | ICD-10-CM | POA: Diagnosis not present

## 2015-09-04 DIAGNOSIS — J3089 Other allergic rhinitis: Secondary | ICD-10-CM | POA: Diagnosis not present

## 2015-09-04 DIAGNOSIS — J301 Allergic rhinitis due to pollen: Secondary | ICD-10-CM | POA: Diagnosis not present

## 2015-09-04 DIAGNOSIS — J3081 Allergic rhinitis due to animal (cat) (dog) hair and dander: Secondary | ICD-10-CM | POA: Diagnosis not present

## 2015-09-11 DIAGNOSIS — J301 Allergic rhinitis due to pollen: Secondary | ICD-10-CM | POA: Diagnosis not present

## 2015-09-11 DIAGNOSIS — J3081 Allergic rhinitis due to animal (cat) (dog) hair and dander: Secondary | ICD-10-CM | POA: Diagnosis not present

## 2015-09-11 DIAGNOSIS — J3089 Other allergic rhinitis: Secondary | ICD-10-CM | POA: Diagnosis not present

## 2015-09-25 DIAGNOSIS — J301 Allergic rhinitis due to pollen: Secondary | ICD-10-CM | POA: Diagnosis not present

## 2015-09-25 DIAGNOSIS — J3089 Other allergic rhinitis: Secondary | ICD-10-CM | POA: Diagnosis not present

## 2015-09-25 DIAGNOSIS — J3081 Allergic rhinitis due to animal (cat) (dog) hair and dander: Secondary | ICD-10-CM | POA: Diagnosis not present

## 2015-10-19 DIAGNOSIS — J301 Allergic rhinitis due to pollen: Secondary | ICD-10-CM | POA: Diagnosis not present

## 2015-10-19 DIAGNOSIS — J3089 Other allergic rhinitis: Secondary | ICD-10-CM | POA: Diagnosis not present

## 2015-10-19 DIAGNOSIS — J3081 Allergic rhinitis due to animal (cat) (dog) hair and dander: Secondary | ICD-10-CM | POA: Diagnosis not present

## 2015-11-02 DIAGNOSIS — J301 Allergic rhinitis due to pollen: Secondary | ICD-10-CM | POA: Diagnosis not present

## 2015-11-02 DIAGNOSIS — J3081 Allergic rhinitis due to animal (cat) (dog) hair and dander: Secondary | ICD-10-CM | POA: Diagnosis not present

## 2015-11-02 DIAGNOSIS — J3089 Other allergic rhinitis: Secondary | ICD-10-CM | POA: Diagnosis not present

## 2015-11-05 DIAGNOSIS — J301 Allergic rhinitis due to pollen: Secondary | ICD-10-CM | POA: Diagnosis not present

## 2015-11-05 DIAGNOSIS — J3089 Other allergic rhinitis: Secondary | ICD-10-CM | POA: Diagnosis not present

## 2015-11-05 DIAGNOSIS — J3081 Allergic rhinitis due to animal (cat) (dog) hair and dander: Secondary | ICD-10-CM | POA: Diagnosis not present

## 2015-11-08 DIAGNOSIS — J3089 Other allergic rhinitis: Secondary | ICD-10-CM | POA: Diagnosis not present

## 2015-11-08 DIAGNOSIS — J3081 Allergic rhinitis due to animal (cat) (dog) hair and dander: Secondary | ICD-10-CM | POA: Diagnosis not present

## 2015-11-08 DIAGNOSIS — J301 Allergic rhinitis due to pollen: Secondary | ICD-10-CM | POA: Diagnosis not present

## 2015-11-13 DIAGNOSIS — J301 Allergic rhinitis due to pollen: Secondary | ICD-10-CM | POA: Diagnosis not present

## 2015-11-13 DIAGNOSIS — J3089 Other allergic rhinitis: Secondary | ICD-10-CM | POA: Diagnosis not present

## 2015-11-13 DIAGNOSIS — J3081 Allergic rhinitis due to animal (cat) (dog) hair and dander: Secondary | ICD-10-CM | POA: Diagnosis not present

## 2015-11-15 DIAGNOSIS — J3089 Other allergic rhinitis: Secondary | ICD-10-CM | POA: Diagnosis not present

## 2015-11-15 DIAGNOSIS — J3081 Allergic rhinitis due to animal (cat) (dog) hair and dander: Secondary | ICD-10-CM | POA: Diagnosis not present

## 2015-11-15 DIAGNOSIS — J301 Allergic rhinitis due to pollen: Secondary | ICD-10-CM | POA: Diagnosis not present

## 2015-11-19 DIAGNOSIS — J301 Allergic rhinitis due to pollen: Secondary | ICD-10-CM | POA: Diagnosis not present

## 2015-11-19 DIAGNOSIS — J3081 Allergic rhinitis due to animal (cat) (dog) hair and dander: Secondary | ICD-10-CM | POA: Diagnosis not present

## 2015-11-19 DIAGNOSIS — J3089 Other allergic rhinitis: Secondary | ICD-10-CM | POA: Diagnosis not present

## 2015-12-03 DIAGNOSIS — J3089 Other allergic rhinitis: Secondary | ICD-10-CM | POA: Diagnosis not present

## 2015-12-03 DIAGNOSIS — J301 Allergic rhinitis due to pollen: Secondary | ICD-10-CM | POA: Diagnosis not present

## 2015-12-03 DIAGNOSIS — J3081 Allergic rhinitis due to animal (cat) (dog) hair and dander: Secondary | ICD-10-CM | POA: Diagnosis not present

## 2015-12-04 DIAGNOSIS — Z1231 Encounter for screening mammogram for malignant neoplasm of breast: Secondary | ICD-10-CM | POA: Diagnosis not present

## 2015-12-04 LAB — HM MAMMOGRAPHY

## 2015-12-06 ENCOUNTER — Encounter: Payer: Self-pay | Admitting: Family Medicine

## 2015-12-17 DIAGNOSIS — J3081 Allergic rhinitis due to animal (cat) (dog) hair and dander: Secondary | ICD-10-CM | POA: Diagnosis not present

## 2015-12-17 DIAGNOSIS — J3089 Other allergic rhinitis: Secondary | ICD-10-CM | POA: Diagnosis not present

## 2015-12-17 DIAGNOSIS — J301 Allergic rhinitis due to pollen: Secondary | ICD-10-CM | POA: Diagnosis not present

## 2016-01-01 DIAGNOSIS — J3089 Other allergic rhinitis: Secondary | ICD-10-CM | POA: Diagnosis not present

## 2016-01-01 DIAGNOSIS — J3081 Allergic rhinitis due to animal (cat) (dog) hair and dander: Secondary | ICD-10-CM | POA: Diagnosis not present

## 2016-01-01 DIAGNOSIS — J301 Allergic rhinitis due to pollen: Secondary | ICD-10-CM | POA: Diagnosis not present

## 2016-01-08 DIAGNOSIS — J3089 Other allergic rhinitis: Secondary | ICD-10-CM | POA: Diagnosis not present

## 2016-01-08 DIAGNOSIS — J3081 Allergic rhinitis due to animal (cat) (dog) hair and dander: Secondary | ICD-10-CM | POA: Diagnosis not present

## 2016-01-08 DIAGNOSIS — J301 Allergic rhinitis due to pollen: Secondary | ICD-10-CM | POA: Diagnosis not present

## 2016-01-14 DIAGNOSIS — J3081 Allergic rhinitis due to animal (cat) (dog) hair and dander: Secondary | ICD-10-CM | POA: Diagnosis not present

## 2016-01-14 DIAGNOSIS — J3089 Other allergic rhinitis: Secondary | ICD-10-CM | POA: Diagnosis not present

## 2016-01-14 DIAGNOSIS — J301 Allergic rhinitis due to pollen: Secondary | ICD-10-CM | POA: Diagnosis not present

## 2016-01-21 DIAGNOSIS — J301 Allergic rhinitis due to pollen: Secondary | ICD-10-CM | POA: Diagnosis not present

## 2016-01-21 DIAGNOSIS — J3081 Allergic rhinitis due to animal (cat) (dog) hair and dander: Secondary | ICD-10-CM | POA: Diagnosis not present

## 2016-01-21 DIAGNOSIS — J3089 Other allergic rhinitis: Secondary | ICD-10-CM | POA: Diagnosis not present

## 2016-01-28 DIAGNOSIS — J3081 Allergic rhinitis due to animal (cat) (dog) hair and dander: Secondary | ICD-10-CM | POA: Diagnosis not present

## 2016-01-28 DIAGNOSIS — J3089 Other allergic rhinitis: Secondary | ICD-10-CM | POA: Diagnosis not present

## 2016-01-28 DIAGNOSIS — J301 Allergic rhinitis due to pollen: Secondary | ICD-10-CM | POA: Diagnosis not present

## 2016-02-14 DIAGNOSIS — J3089 Other allergic rhinitis: Secondary | ICD-10-CM | POA: Diagnosis not present

## 2016-02-14 DIAGNOSIS — J301 Allergic rhinitis due to pollen: Secondary | ICD-10-CM | POA: Diagnosis not present

## 2016-02-14 DIAGNOSIS — J3081 Allergic rhinitis due to animal (cat) (dog) hair and dander: Secondary | ICD-10-CM | POA: Diagnosis not present

## 2016-03-06 DIAGNOSIS — Z9109 Other allergy status, other than to drugs and biological substances: Secondary | ICD-10-CM | POA: Diagnosis not present

## 2016-03-06 DIAGNOSIS — Z1389 Encounter for screening for other disorder: Secondary | ICD-10-CM | POA: Diagnosis not present

## 2016-03-06 DIAGNOSIS — N952 Postmenopausal atrophic vaginitis: Secondary | ICD-10-CM | POA: Diagnosis not present

## 2016-03-06 DIAGNOSIS — Z85118 Personal history of other malignant neoplasm of bronchus and lung: Secondary | ICD-10-CM | POA: Diagnosis not present

## 2016-03-06 DIAGNOSIS — I10 Essential (primary) hypertension: Secondary | ICD-10-CM | POA: Diagnosis not present

## 2016-03-10 DIAGNOSIS — I10 Essential (primary) hypertension: Secondary | ICD-10-CM | POA: Diagnosis not present

## 2016-03-14 DIAGNOSIS — Z23 Encounter for immunization: Secondary | ICD-10-CM | POA: Diagnosis not present

## 2016-03-21 DIAGNOSIS — J3089 Other allergic rhinitis: Secondary | ICD-10-CM | POA: Diagnosis not present

## 2016-03-21 DIAGNOSIS — J301 Allergic rhinitis due to pollen: Secondary | ICD-10-CM | POA: Diagnosis not present

## 2016-03-21 DIAGNOSIS — J3081 Allergic rhinitis due to animal (cat) (dog) hair and dander: Secondary | ICD-10-CM | POA: Diagnosis not present

## 2016-04-01 DIAGNOSIS — Z0000 Encounter for general adult medical examination without abnormal findings: Secondary | ICD-10-CM | POA: Diagnosis not present

## 2016-04-16 DIAGNOSIS — Z1211 Encounter for screening for malignant neoplasm of colon: Secondary | ICD-10-CM | POA: Diagnosis not present

## 2016-04-16 DIAGNOSIS — Z1212 Encounter for screening for malignant neoplasm of rectum: Secondary | ICD-10-CM | POA: Diagnosis not present

## 2016-04-22 DIAGNOSIS — J3081 Allergic rhinitis due to animal (cat) (dog) hair and dander: Secondary | ICD-10-CM | POA: Diagnosis not present

## 2016-04-22 DIAGNOSIS — J45909 Unspecified asthma, uncomplicated: Secondary | ICD-10-CM | POA: Diagnosis not present

## 2016-04-22 DIAGNOSIS — J3089 Other allergic rhinitis: Secondary | ICD-10-CM | POA: Diagnosis not present

## 2016-04-22 DIAGNOSIS — J301 Allergic rhinitis due to pollen: Secondary | ICD-10-CM | POA: Diagnosis not present

## 2016-04-28 DIAGNOSIS — J301 Allergic rhinitis due to pollen: Secondary | ICD-10-CM | POA: Diagnosis not present

## 2016-04-28 DIAGNOSIS — J3089 Other allergic rhinitis: Secondary | ICD-10-CM | POA: Diagnosis not present

## 2016-04-28 DIAGNOSIS — J3081 Allergic rhinitis due to animal (cat) (dog) hair and dander: Secondary | ICD-10-CM | POA: Diagnosis not present

## 2016-05-12 DIAGNOSIS — J301 Allergic rhinitis due to pollen: Secondary | ICD-10-CM | POA: Diagnosis not present

## 2016-05-12 DIAGNOSIS — J3089 Other allergic rhinitis: Secondary | ICD-10-CM | POA: Diagnosis not present

## 2016-05-12 DIAGNOSIS — J3081 Allergic rhinitis due to animal (cat) (dog) hair and dander: Secondary | ICD-10-CM | POA: Diagnosis not present

## 2016-05-13 DIAGNOSIS — J3089 Other allergic rhinitis: Secondary | ICD-10-CM | POA: Diagnosis not present

## 2016-05-13 DIAGNOSIS — J301 Allergic rhinitis due to pollen: Secondary | ICD-10-CM | POA: Diagnosis not present

## 2016-05-13 DIAGNOSIS — J3081 Allergic rhinitis due to animal (cat) (dog) hair and dander: Secondary | ICD-10-CM | POA: Diagnosis not present

## 2016-05-19 DIAGNOSIS — J301 Allergic rhinitis due to pollen: Secondary | ICD-10-CM | POA: Diagnosis not present

## 2016-05-19 DIAGNOSIS — J3089 Other allergic rhinitis: Secondary | ICD-10-CM | POA: Diagnosis not present

## 2016-05-19 DIAGNOSIS — J3081 Allergic rhinitis due to animal (cat) (dog) hair and dander: Secondary | ICD-10-CM | POA: Diagnosis not present

## 2016-05-21 ENCOUNTER — Other Ambulatory Visit: Payer: Self-pay | Admitting: *Deleted

## 2016-05-21 DIAGNOSIS — Z85118 Personal history of other malignant neoplasm of bronchus and lung: Secondary | ICD-10-CM

## 2016-05-26 DIAGNOSIS — J3089 Other allergic rhinitis: Secondary | ICD-10-CM | POA: Diagnosis not present

## 2016-05-26 DIAGNOSIS — J3081 Allergic rhinitis due to animal (cat) (dog) hair and dander: Secondary | ICD-10-CM | POA: Diagnosis not present

## 2016-05-26 DIAGNOSIS — J301 Allergic rhinitis due to pollen: Secondary | ICD-10-CM | POA: Diagnosis not present

## 2016-05-27 DIAGNOSIS — J3081 Allergic rhinitis due to animal (cat) (dog) hair and dander: Secondary | ICD-10-CM | POA: Diagnosis not present

## 2016-05-27 DIAGNOSIS — J301 Allergic rhinitis due to pollen: Secondary | ICD-10-CM | POA: Diagnosis not present

## 2016-05-27 DIAGNOSIS — J3089 Other allergic rhinitis: Secondary | ICD-10-CM | POA: Diagnosis not present

## 2016-05-30 DIAGNOSIS — H25813 Combined forms of age-related cataract, bilateral: Secondary | ICD-10-CM | POA: Diagnosis not present

## 2016-06-02 DIAGNOSIS — J301 Allergic rhinitis due to pollen: Secondary | ICD-10-CM | POA: Diagnosis not present

## 2016-06-02 DIAGNOSIS — J3089 Other allergic rhinitis: Secondary | ICD-10-CM | POA: Diagnosis not present

## 2016-06-02 DIAGNOSIS — J3081 Allergic rhinitis due to animal (cat) (dog) hair and dander: Secondary | ICD-10-CM | POA: Diagnosis not present

## 2016-06-03 DIAGNOSIS — J3081 Allergic rhinitis due to animal (cat) (dog) hair and dander: Secondary | ICD-10-CM | POA: Diagnosis not present

## 2016-06-03 DIAGNOSIS — J3089 Other allergic rhinitis: Secondary | ICD-10-CM | POA: Diagnosis not present

## 2016-06-03 DIAGNOSIS — J301 Allergic rhinitis due to pollen: Secondary | ICD-10-CM | POA: Diagnosis not present

## 2016-06-09 DIAGNOSIS — J3081 Allergic rhinitis due to animal (cat) (dog) hair and dander: Secondary | ICD-10-CM | POA: Diagnosis not present

## 2016-06-09 DIAGNOSIS — J3089 Other allergic rhinitis: Secondary | ICD-10-CM | POA: Diagnosis not present

## 2016-06-09 DIAGNOSIS — J301 Allergic rhinitis due to pollen: Secondary | ICD-10-CM | POA: Diagnosis not present

## 2016-06-10 ENCOUNTER — Ambulatory Visit
Admission: RE | Admit: 2016-06-10 | Discharge: 2016-06-10 | Disposition: A | Discharge status: Home / Self Care | Payer: Medicare Other | Source: Ambulatory Visit | Attending: Thoracic Surgery (Cardiothoracic Vascular Surgery) | Admitting: Thoracic Surgery (Cardiothoracic Vascular Surgery)

## 2016-06-10 ENCOUNTER — Encounter: Payer: Self-pay | Admitting: Thoracic Surgery (Cardiothoracic Vascular Surgery)

## 2016-06-10 ENCOUNTER — Encounter: Payer: Medicare Other | Admitting: Thoracic Surgery (Cardiothoracic Vascular Surgery)

## 2016-06-10 ENCOUNTER — Ambulatory Visit (INDEPENDENT_AMBULATORY_CARE_PROVIDER_SITE_OTHER): Payer: Medicare Other | Admitting: Thoracic Surgery (Cardiothoracic Vascular Surgery)

## 2016-06-10 VITALS — BP 160/76 | HR 76 | Resp 20 | Ht 62.0 in | Wt 127.0 lb

## 2016-06-10 DIAGNOSIS — Z85118 Personal history of other malignant neoplasm of bronchus and lung: Secondary | ICD-10-CM | POA: Diagnosis not present

## 2016-06-10 DIAGNOSIS — C3432 Malignant neoplasm of lower lobe, left bronchus or lung: Secondary | ICD-10-CM | POA: Diagnosis not present

## 2016-06-10 DIAGNOSIS — Z09 Encounter for follow-up examination after completed treatment for conditions other than malignant neoplasm: Secondary | ICD-10-CM | POA: Diagnosis not present

## 2016-06-10 DIAGNOSIS — R918 Other nonspecific abnormal finding of lung field: Secondary | ICD-10-CM | POA: Diagnosis not present

## 2016-06-10 NOTE — Progress Notes (Signed)
[address]       [place] [address]             [phone]       HPI: Doris Barton returns today for a scheduled follow-up visit.  She is a 70 year old nonsmoker who had a groundglass opacity in the left lower lobe. We followed that for a couple of years and it was stable initially. But at 3 years, it had increased in size slightly.   I did a wedge resection on her in September 2014. 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 There was a small focus of invasive carcinoma. She was stage IA.  I last saw her in the office a year ago. She was doing well at that time with no evidence recurrent disease.   In the interim since her last visit she has continued to do well. Her appetite is good. She has not had any weight loss. She's not having any chest pain or shortness of breath. She has not had any unusual headaches or visual changes. She is a lifelong nonsmoker. She has not had any other significant health issues since her last visit.  Past Medical History:  Diagnosis Date  . Arthritis   . Asthma    last flare 6 mos to year ago  . Blood in urine 3 years ago  . Carcinoma, lung ([place])    Invasive adenocarcinoma status post LLL wedge resection with negative margin  . Headache(784.0)    migraines none in last 10-15 years ago  . Hypertension   . PONV (postoperative nausea and vomiting)   '   Current Outpatient Prescriptions  Medication Sig Dispense Refill  . EPIPEN 2-PAK 0.3 MG/0.3ML DEVI Inject 0.3 mg into the skin once.     Marland Kitchen estradiol (ESTRACE VAGINAL) 0.1 MG/GM vaginal cream Apply small amounts twice weekly 42.5 g 11  . losartan (COZAAR) 50 MG tablet Take 1 tablet (50 mg total) by mouth daily. 90 tablet 3   No current facility-administered medications for this visit.     Physical Exam BP (!) 160/76 (BP Location: Left Arm, Patient Position: Sitting, Cuff Size: Small)   Pulse 76   Resp 20   Ht '5\' 2"'$  (1.575 m)   Wt 127 lb  (57.6 kg)   SpO2 96% Comment: RA  BMI 23.30 kg/m  70 year old woman in no acute distress Alert and oriented 3 with no focal deficits No cervical or supraclavicular adenopathy Lungs clear with equal breath sounds bilaterally Cardiac regular rate and rhythm normal S1 and S2  Diagnostic Tests: CT CHEST WITHOUT CONTRAST  TECHNIQUE: Multidetector CT imaging of the chest was performed following the standard protocol without IV contrast.  COMPARISON:  06/05/2015  FINDINGS: Cardiovascular: The heart size is normal. No pericardial effusion. Atherosclerotic calcification is noted in the wall of the thoracic aorta.  Mediastinum/Nodes: No mediastinal lymphadenopathy. No evidence for gross hilar lymphadenopathy although assessment is limited by the lack of intravenous contrast on today's study. The esophagus has normal imaging features. There is no axillary lymphadenopathy.  Lungs/Pleura: Biapical pleural-parenchymal scarring is stable postoperative changes from prior wedge resection again noted left lower lobe. Pleural-based area of pleural-parenchymal all thickening in the lateral lingula is similar to previous and likely represents scar. No suspicious pulmonary nodule or mass. No focal airspace consolidation. No pulmonary edema or pleural effusion.  Upper Abdomen: Stable 9 mm hypo attenuating lesion in the dome of liver, likely a cyst.  Musculoskeletal:  Bone windows reveal no worrisome lytic or sclerotic osseous lesions.  IMPRESSION: 1. Stable appearance postsurgical change left lower lobe. 2. No new or progressive findings on today's exam.   Electronically Signed   By: Misty [place] M.D.   On: 06/10/2016 13:32 I personally reviewed the CT chest and concur with the findings noted above.  Impression: Doris Barton is a 70 year old woman who is 3 years out from a wedge resection for a stage IA adenocarcinoma with lepidic spread. She did not require adjuvant  therapy. She is doing well with no evidence recurrent disease.  Hypertension- blood pressure was elevated today. That is being managed by Dr. Stevie Kern.  Plan:  Return in one year with CT chest  I spent 15 minutes with Doris Barton during this visit  Melrose Nakayama, MD [facility] [phone]

## 2016-06-16 DIAGNOSIS — J3089 Other allergic rhinitis: Secondary | ICD-10-CM | POA: Diagnosis not present

## 2016-06-16 DIAGNOSIS — J301 Allergic rhinitis due to pollen: Secondary | ICD-10-CM | POA: Diagnosis not present

## 2016-06-16 DIAGNOSIS — J3081 Allergic rhinitis due to animal (cat) (dog) hair and dander: Secondary | ICD-10-CM | POA: Diagnosis not present

## 2016-06-17 DIAGNOSIS — J301 Allergic rhinitis due to pollen: Secondary | ICD-10-CM | POA: Diagnosis not present

## 2016-06-17 DIAGNOSIS — J3081 Allergic rhinitis due to animal (cat) (dog) hair and dander: Secondary | ICD-10-CM | POA: Diagnosis not present

## 2016-06-17 DIAGNOSIS — J3089 Other allergic rhinitis: Secondary | ICD-10-CM | POA: Diagnosis not present

## 2016-06-23 DIAGNOSIS — J3081 Allergic rhinitis due to animal (cat) (dog) hair and dander: Secondary | ICD-10-CM | POA: Diagnosis not present

## 2016-06-23 DIAGNOSIS — J3089 Other allergic rhinitis: Secondary | ICD-10-CM | POA: Diagnosis not present

## 2016-06-23 DIAGNOSIS — J301 Allergic rhinitis due to pollen: Secondary | ICD-10-CM | POA: Diagnosis not present

## 2016-06-30 DIAGNOSIS — J3089 Other allergic rhinitis: Secondary | ICD-10-CM | POA: Diagnosis not present

## 2016-06-30 DIAGNOSIS — J301 Allergic rhinitis due to pollen: Secondary | ICD-10-CM | POA: Diagnosis not present

## 2016-06-30 DIAGNOSIS — J3081 Allergic rhinitis due to animal (cat) (dog) hair and dander: Secondary | ICD-10-CM | POA: Diagnosis not present

## 2016-07-01 DIAGNOSIS — J301 Allergic rhinitis due to pollen: Secondary | ICD-10-CM | POA: Diagnosis not present

## 2016-07-01 DIAGNOSIS — J3089 Other allergic rhinitis: Secondary | ICD-10-CM | POA: Diagnosis not present

## 2016-07-01 DIAGNOSIS — J3081 Allergic rhinitis due to animal (cat) (dog) hair and dander: Secondary | ICD-10-CM | POA: Diagnosis not present

## 2016-07-07 DIAGNOSIS — J301 Allergic rhinitis due to pollen: Secondary | ICD-10-CM | POA: Diagnosis not present

## 2016-07-07 DIAGNOSIS — J3089 Other allergic rhinitis: Secondary | ICD-10-CM | POA: Diagnosis not present

## 2016-07-07 DIAGNOSIS — J3081 Allergic rhinitis due to animal (cat) (dog) hair and dander: Secondary | ICD-10-CM | POA: Diagnosis not present

## 2016-07-08 DIAGNOSIS — J3089 Other allergic rhinitis: Secondary | ICD-10-CM | POA: Diagnosis not present

## 2016-07-08 DIAGNOSIS — J301 Allergic rhinitis due to pollen: Secondary | ICD-10-CM | POA: Diagnosis not present

## 2016-07-08 DIAGNOSIS — J3081 Allergic rhinitis due to animal (cat) (dog) hair and dander: Secondary | ICD-10-CM | POA: Diagnosis not present

## 2016-07-14 DIAGNOSIS — J3081 Allergic rhinitis due to animal (cat) (dog) hair and dander: Secondary | ICD-10-CM | POA: Diagnosis not present

## 2016-07-14 DIAGNOSIS — J3089 Other allergic rhinitis: Secondary | ICD-10-CM | POA: Diagnosis not present

## 2016-07-14 DIAGNOSIS — J301 Allergic rhinitis due to pollen: Secondary | ICD-10-CM | POA: Diagnosis not present

## 2016-07-15 DIAGNOSIS — J301 Allergic rhinitis due to pollen: Secondary | ICD-10-CM | POA: Diagnosis not present

## 2016-07-15 DIAGNOSIS — J3081 Allergic rhinitis due to animal (cat) (dog) hair and dander: Secondary | ICD-10-CM | POA: Diagnosis not present

## 2016-07-15 DIAGNOSIS — J3089 Other allergic rhinitis: Secondary | ICD-10-CM | POA: Diagnosis not present

## 2016-07-21 DIAGNOSIS — J3081 Allergic rhinitis due to animal (cat) (dog) hair and dander: Secondary | ICD-10-CM | POA: Diagnosis not present

## 2016-07-21 DIAGNOSIS — J3089 Other allergic rhinitis: Secondary | ICD-10-CM | POA: Diagnosis not present

## 2016-07-21 DIAGNOSIS — J301 Allergic rhinitis due to pollen: Secondary | ICD-10-CM | POA: Diagnosis not present

## 2016-07-22 DIAGNOSIS — J3089 Other allergic rhinitis: Secondary | ICD-10-CM | POA: Diagnosis not present

## 2016-07-22 DIAGNOSIS — J301 Allergic rhinitis due to pollen: Secondary | ICD-10-CM | POA: Diagnosis not present

## 2016-07-22 DIAGNOSIS — J3081 Allergic rhinitis due to animal (cat) (dog) hair and dander: Secondary | ICD-10-CM | POA: Diagnosis not present

## 2016-08-05 DIAGNOSIS — J3089 Other allergic rhinitis: Secondary | ICD-10-CM | POA: Diagnosis not present

## 2016-08-05 DIAGNOSIS — J301 Allergic rhinitis due to pollen: Secondary | ICD-10-CM | POA: Diagnosis not present

## 2016-08-05 DIAGNOSIS — J3081 Allergic rhinitis due to animal (cat) (dog) hair and dander: Secondary | ICD-10-CM | POA: Diagnosis not present

## 2016-08-11 DIAGNOSIS — B029 Zoster without complications: Secondary | ICD-10-CM | POA: Diagnosis not present

## 2016-09-01 DIAGNOSIS — J3089 Other allergic rhinitis: Secondary | ICD-10-CM | POA: Diagnosis not present

## 2016-09-01 DIAGNOSIS — J3081 Allergic rhinitis due to animal (cat) (dog) hair and dander: Secondary | ICD-10-CM | POA: Diagnosis not present

## 2016-09-02 DIAGNOSIS — J301 Allergic rhinitis due to pollen: Secondary | ICD-10-CM | POA: Diagnosis not present

## 2016-09-02 DIAGNOSIS — J3089 Other allergic rhinitis: Secondary | ICD-10-CM | POA: Diagnosis not present

## 2016-09-02 DIAGNOSIS — J3081 Allergic rhinitis due to animal (cat) (dog) hair and dander: Secondary | ICD-10-CM | POA: Diagnosis not present

## 2016-09-08 DIAGNOSIS — J3089 Other allergic rhinitis: Secondary | ICD-10-CM | POA: Diagnosis not present

## 2016-09-08 DIAGNOSIS — J3081 Allergic rhinitis due to animal (cat) (dog) hair and dander: Secondary | ICD-10-CM | POA: Diagnosis not present

## 2016-09-08 DIAGNOSIS — J301 Allergic rhinitis due to pollen: Secondary | ICD-10-CM | POA: Diagnosis not present

## 2016-09-09 DIAGNOSIS — J309 Allergic rhinitis, unspecified: Secondary | ICD-10-CM | POA: Diagnosis not present

## 2016-09-09 DIAGNOSIS — I10 Essential (primary) hypertension: Secondary | ICD-10-CM | POA: Diagnosis not present

## 2016-09-09 DIAGNOSIS — J3089 Other allergic rhinitis: Secondary | ICD-10-CM | POA: Diagnosis not present

## 2016-09-09 DIAGNOSIS — J3081 Allergic rhinitis due to animal (cat) (dog) hair and dander: Secondary | ICD-10-CM | POA: Diagnosis not present

## 2016-09-09 DIAGNOSIS — Z85118 Personal history of other malignant neoplasm of bronchus and lung: Secondary | ICD-10-CM | POA: Diagnosis not present

## 2016-09-09 DIAGNOSIS — J301 Allergic rhinitis due to pollen: Secondary | ICD-10-CM | POA: Diagnosis not present

## 2016-09-15 DIAGNOSIS — J3089 Other allergic rhinitis: Secondary | ICD-10-CM | POA: Diagnosis not present

## 2016-09-15 DIAGNOSIS — J301 Allergic rhinitis due to pollen: Secondary | ICD-10-CM | POA: Diagnosis not present

## 2016-09-15 DIAGNOSIS — J3081 Allergic rhinitis due to animal (cat) (dog) hair and dander: Secondary | ICD-10-CM | POA: Diagnosis not present

## 2016-09-16 DIAGNOSIS — J3089 Other allergic rhinitis: Secondary | ICD-10-CM | POA: Diagnosis not present

## 2016-09-16 DIAGNOSIS — J301 Allergic rhinitis due to pollen: Secondary | ICD-10-CM | POA: Diagnosis not present

## 2016-09-22 DIAGNOSIS — J3081 Allergic rhinitis due to animal (cat) (dog) hair and dander: Secondary | ICD-10-CM | POA: Diagnosis not present

## 2016-09-22 DIAGNOSIS — J3089 Other allergic rhinitis: Secondary | ICD-10-CM | POA: Diagnosis not present

## 2016-09-22 DIAGNOSIS — J301 Allergic rhinitis due to pollen: Secondary | ICD-10-CM | POA: Diagnosis not present

## 2016-09-29 DIAGNOSIS — J3081 Allergic rhinitis due to animal (cat) (dog) hair and dander: Secondary | ICD-10-CM | POA: Diagnosis not present

## 2016-09-29 DIAGNOSIS — J301 Allergic rhinitis due to pollen: Secondary | ICD-10-CM | POA: Diagnosis not present

## 2016-09-29 DIAGNOSIS — J3089 Other allergic rhinitis: Secondary | ICD-10-CM | POA: Diagnosis not present

## 2016-09-30 DIAGNOSIS — J301 Allergic rhinitis due to pollen: Secondary | ICD-10-CM | POA: Diagnosis not present

## 2016-09-30 DIAGNOSIS — J3081 Allergic rhinitis due to animal (cat) (dog) hair and dander: Secondary | ICD-10-CM | POA: Diagnosis not present

## 2016-09-30 DIAGNOSIS — J3089 Other allergic rhinitis: Secondary | ICD-10-CM | POA: Diagnosis not present

## 2016-10-01 DIAGNOSIS — J301 Allergic rhinitis due to pollen: Secondary | ICD-10-CM | POA: Diagnosis not present

## 2016-10-01 DIAGNOSIS — J3089 Other allergic rhinitis: Secondary | ICD-10-CM | POA: Diagnosis not present

## 2016-10-01 DIAGNOSIS — J3081 Allergic rhinitis due to animal (cat) (dog) hair and dander: Secondary | ICD-10-CM | POA: Diagnosis not present

## 2016-10-06 DIAGNOSIS — J3089 Other allergic rhinitis: Secondary | ICD-10-CM | POA: Diagnosis not present

## 2016-10-06 DIAGNOSIS — J3081 Allergic rhinitis due to animal (cat) (dog) hair and dander: Secondary | ICD-10-CM | POA: Diagnosis not present

## 2016-10-06 DIAGNOSIS — J301 Allergic rhinitis due to pollen: Secondary | ICD-10-CM | POA: Diagnosis not present

## 2016-10-20 DIAGNOSIS — J3081 Allergic rhinitis due to animal (cat) (dog) hair and dander: Secondary | ICD-10-CM | POA: Diagnosis not present

## 2016-10-20 DIAGNOSIS — J301 Allergic rhinitis due to pollen: Secondary | ICD-10-CM | POA: Diagnosis not present

## 2016-10-20 DIAGNOSIS — J3089 Other allergic rhinitis: Secondary | ICD-10-CM | POA: Diagnosis not present

## 2016-11-10 DIAGNOSIS — J3081 Allergic rhinitis due to animal (cat) (dog) hair and dander: Secondary | ICD-10-CM | POA: Diagnosis not present

## 2016-11-10 DIAGNOSIS — J3089 Other allergic rhinitis: Secondary | ICD-10-CM | POA: Diagnosis not present

## 2016-11-10 DIAGNOSIS — J301 Allergic rhinitis due to pollen: Secondary | ICD-10-CM | POA: Diagnosis not present

## 2016-11-24 DIAGNOSIS — J3089 Other allergic rhinitis: Secondary | ICD-10-CM | POA: Diagnosis not present

## 2016-11-24 DIAGNOSIS — J3081 Allergic rhinitis due to animal (cat) (dog) hair and dander: Secondary | ICD-10-CM | POA: Diagnosis not present

## 2016-11-24 DIAGNOSIS — J301 Allergic rhinitis due to pollen: Secondary | ICD-10-CM | POA: Diagnosis not present

## 2016-12-08 DIAGNOSIS — J301 Allergic rhinitis due to pollen: Secondary | ICD-10-CM | POA: Diagnosis not present

## 2016-12-08 DIAGNOSIS — Z1231 Encounter for screening mammogram for malignant neoplasm of breast: Secondary | ICD-10-CM | POA: Diagnosis not present

## 2016-12-08 DIAGNOSIS — J3089 Other allergic rhinitis: Secondary | ICD-10-CM | POA: Diagnosis not present

## 2016-12-08 DIAGNOSIS — J3081 Allergic rhinitis due to animal (cat) (dog) hair and dander: Secondary | ICD-10-CM | POA: Diagnosis not present

## 2016-12-22 DIAGNOSIS — J3081 Allergic rhinitis due to animal (cat) (dog) hair and dander: Secondary | ICD-10-CM | POA: Diagnosis not present

## 2016-12-22 DIAGNOSIS — J301 Allergic rhinitis due to pollen: Secondary | ICD-10-CM | POA: Diagnosis not present

## 2016-12-22 DIAGNOSIS — J3089 Other allergic rhinitis: Secondary | ICD-10-CM | POA: Diagnosis not present

## 2017-01-05 DIAGNOSIS — J301 Allergic rhinitis due to pollen: Secondary | ICD-10-CM | POA: Diagnosis not present

## 2017-01-05 DIAGNOSIS — J3081 Allergic rhinitis due to animal (cat) (dog) hair and dander: Secondary | ICD-10-CM | POA: Diagnosis not present

## 2017-01-05 DIAGNOSIS — J3089 Other allergic rhinitis: Secondary | ICD-10-CM | POA: Diagnosis not present

## 2017-01-19 DIAGNOSIS — J301 Allergic rhinitis due to pollen: Secondary | ICD-10-CM | POA: Diagnosis not present

## 2017-01-19 DIAGNOSIS — J3089 Other allergic rhinitis: Secondary | ICD-10-CM | POA: Diagnosis not present

## 2017-01-19 DIAGNOSIS — J3081 Allergic rhinitis due to animal (cat) (dog) hair and dander: Secondary | ICD-10-CM | POA: Diagnosis not present

## 2017-02-02 DIAGNOSIS — J301 Allergic rhinitis due to pollen: Secondary | ICD-10-CM | POA: Diagnosis not present

## 2017-02-02 DIAGNOSIS — J3089 Other allergic rhinitis: Secondary | ICD-10-CM | POA: Diagnosis not present

## 2017-02-02 DIAGNOSIS — J3081 Allergic rhinitis due to animal (cat) (dog) hair and dander: Secondary | ICD-10-CM | POA: Diagnosis not present

## 2017-02-06 DIAGNOSIS — J3089 Other allergic rhinitis: Secondary | ICD-10-CM | POA: Diagnosis not present

## 2017-02-06 DIAGNOSIS — J301 Allergic rhinitis due to pollen: Secondary | ICD-10-CM | POA: Diagnosis not present

## 2017-02-06 DIAGNOSIS — J3081 Allergic rhinitis due to animal (cat) (dog) hair and dander: Secondary | ICD-10-CM | POA: Diagnosis not present

## 2017-02-16 DIAGNOSIS — J301 Allergic rhinitis due to pollen: Secondary | ICD-10-CM | POA: Diagnosis not present

## 2017-02-16 DIAGNOSIS — J3081 Allergic rhinitis due to animal (cat) (dog) hair and dander: Secondary | ICD-10-CM | POA: Diagnosis not present

## 2017-02-16 DIAGNOSIS — J3089 Other allergic rhinitis: Secondary | ICD-10-CM | POA: Diagnosis not present

## 2017-02-23 ENCOUNTER — Other Ambulatory Visit: Payer: Self-pay

## 2017-03-02 DIAGNOSIS — J301 Allergic rhinitis due to pollen: Secondary | ICD-10-CM | POA: Diagnosis not present

## 2017-03-02 DIAGNOSIS — J3081 Allergic rhinitis due to animal (cat) (dog) hair and dander: Secondary | ICD-10-CM | POA: Diagnosis not present

## 2017-03-02 DIAGNOSIS — J3089 Other allergic rhinitis: Secondary | ICD-10-CM | POA: Diagnosis not present

## 2017-03-09 DIAGNOSIS — I10 Essential (primary) hypertension: Secondary | ICD-10-CM | POA: Diagnosis not present

## 2017-03-09 DIAGNOSIS — Z6824 Body mass index (BMI) 24.0-24.9, adult: Secondary | ICD-10-CM | POA: Diagnosis not present

## 2017-03-09 DIAGNOSIS — Z85118 Personal history of other malignant neoplasm of bronchus and lung: Secondary | ICD-10-CM | POA: Diagnosis not present

## 2017-03-12 DIAGNOSIS — I10 Essential (primary) hypertension: Secondary | ICD-10-CM | POA: Diagnosis not present

## 2017-03-17 DIAGNOSIS — J3081 Allergic rhinitis due to animal (cat) (dog) hair and dander: Secondary | ICD-10-CM | POA: Diagnosis not present

## 2017-03-17 DIAGNOSIS — J301 Allergic rhinitis due to pollen: Secondary | ICD-10-CM | POA: Diagnosis not present

## 2017-03-17 DIAGNOSIS — J3089 Other allergic rhinitis: Secondary | ICD-10-CM | POA: Diagnosis not present

## 2017-03-30 DIAGNOSIS — J301 Allergic rhinitis due to pollen: Secondary | ICD-10-CM | POA: Diagnosis not present

## 2017-03-30 DIAGNOSIS — J3089 Other allergic rhinitis: Secondary | ICD-10-CM | POA: Diagnosis not present

## 2017-03-30 DIAGNOSIS — J3081 Allergic rhinitis due to animal (cat) (dog) hair and dander: Secondary | ICD-10-CM | POA: Diagnosis not present

## 2017-03-31 DIAGNOSIS — Z23 Encounter for immunization: Secondary | ICD-10-CM | POA: Diagnosis not present

## 2017-04-14 DIAGNOSIS — J3081 Allergic rhinitis due to animal (cat) (dog) hair and dander: Secondary | ICD-10-CM | POA: Diagnosis not present

## 2017-04-14 DIAGNOSIS — J3089 Other allergic rhinitis: Secondary | ICD-10-CM | POA: Diagnosis not present

## 2017-04-14 DIAGNOSIS — J301 Allergic rhinitis due to pollen: Secondary | ICD-10-CM | POA: Diagnosis not present

## 2017-04-24 ENCOUNTER — Encounter: Payer: Self-pay | Admitting: Family Medicine

## 2017-04-28 DIAGNOSIS — J3081 Allergic rhinitis due to animal (cat) (dog) hair and dander: Secondary | ICD-10-CM | POA: Diagnosis not present

## 2017-04-28 DIAGNOSIS — J301 Allergic rhinitis due to pollen: Secondary | ICD-10-CM | POA: Diagnosis not present

## 2017-04-28 DIAGNOSIS — J3089 Other allergic rhinitis: Secondary | ICD-10-CM | POA: Diagnosis not present

## 2017-05-04 DIAGNOSIS — Z1389 Encounter for screening for other disorder: Secondary | ICD-10-CM | POA: Diagnosis not present

## 2017-05-04 DIAGNOSIS — R42 Dizziness and giddiness: Secondary | ICD-10-CM | POA: Diagnosis not present

## 2017-05-18 DIAGNOSIS — J3081 Allergic rhinitis due to animal (cat) (dog) hair and dander: Secondary | ICD-10-CM | POA: Diagnosis not present

## 2017-05-18 DIAGNOSIS — J3089 Other allergic rhinitis: Secondary | ICD-10-CM | POA: Diagnosis not present

## 2017-05-18 DIAGNOSIS — J301 Allergic rhinitis due to pollen: Secondary | ICD-10-CM | POA: Diagnosis not present

## 2017-05-26 DIAGNOSIS — J301 Allergic rhinitis due to pollen: Secondary | ICD-10-CM | POA: Diagnosis not present

## 2017-05-26 DIAGNOSIS — R51 Headache: Secondary | ICD-10-CM | POA: Diagnosis not present

## 2017-05-26 DIAGNOSIS — J3089 Other allergic rhinitis: Secondary | ICD-10-CM | POA: Diagnosis not present

## 2017-05-26 DIAGNOSIS — J3081 Allergic rhinitis due to animal (cat) (dog) hair and dander: Secondary | ICD-10-CM | POA: Diagnosis not present

## 2017-05-26 DIAGNOSIS — J45909 Unspecified asthma, uncomplicated: Secondary | ICD-10-CM | POA: Diagnosis not present

## 2017-05-28 ENCOUNTER — Other Ambulatory Visit: Payer: Self-pay | Admitting: Thoracic Surgery (Cardiothoracic Vascular Surgery)

## 2017-05-28 DIAGNOSIS — C3490 Malignant neoplasm of unspecified part of unspecified bronchus or lung: Secondary | ICD-10-CM

## 2017-06-08 DIAGNOSIS — J3089 Other allergic rhinitis: Secondary | ICD-10-CM | POA: Diagnosis not present

## 2017-06-08 DIAGNOSIS — J301 Allergic rhinitis due to pollen: Secondary | ICD-10-CM | POA: Diagnosis not present

## 2017-06-08 DIAGNOSIS — J3081 Allergic rhinitis due to animal (cat) (dog) hair and dander: Secondary | ICD-10-CM | POA: Diagnosis not present

## 2017-06-15 DIAGNOSIS — J3089 Other allergic rhinitis: Secondary | ICD-10-CM | POA: Diagnosis not present

## 2017-06-15 DIAGNOSIS — J3081 Allergic rhinitis due to animal (cat) (dog) hair and dander: Secondary | ICD-10-CM | POA: Diagnosis not present

## 2017-06-15 DIAGNOSIS — J301 Allergic rhinitis due to pollen: Secondary | ICD-10-CM | POA: Diagnosis not present

## 2017-06-22 DIAGNOSIS — J3089 Other allergic rhinitis: Secondary | ICD-10-CM | POA: Diagnosis not present

## 2017-06-22 DIAGNOSIS — J3081 Allergic rhinitis due to animal (cat) (dog) hair and dander: Secondary | ICD-10-CM | POA: Diagnosis not present

## 2017-06-22 DIAGNOSIS — J301 Allergic rhinitis due to pollen: Secondary | ICD-10-CM | POA: Diagnosis not present

## 2017-06-30 ENCOUNTER — Ambulatory Visit: Payer: Medicare Other | Admitting: Thoracic Surgery (Cardiothoracic Vascular Surgery)

## 2017-06-30 ENCOUNTER — Other Ambulatory Visit: Payer: Medicare Other

## 2017-06-30 DIAGNOSIS — L430 Hypertrophic lichen planus: Secondary | ICD-10-CM | POA: Diagnosis not present

## 2017-06-30 DIAGNOSIS — I10 Essential (primary) hypertension: Secondary | ICD-10-CM | POA: Diagnosis not present

## 2017-06-30 DIAGNOSIS — H9319 Tinnitus, unspecified ear: Secondary | ICD-10-CM | POA: Diagnosis not present

## 2017-07-02 DIAGNOSIS — H25043 Posterior subcapsular polar age-related cataract, bilateral: Secondary | ICD-10-CM | POA: Diagnosis not present

## 2017-07-02 DIAGNOSIS — H2513 Age-related nuclear cataract, bilateral: Secondary | ICD-10-CM | POA: Diagnosis not present

## 2017-07-02 DIAGNOSIS — H524 Presbyopia: Secondary | ICD-10-CM | POA: Diagnosis not present

## 2017-07-02 DIAGNOSIS — H5213 Myopia, bilateral: Secondary | ICD-10-CM | POA: Diagnosis not present

## 2017-07-06 DIAGNOSIS — J3089 Other allergic rhinitis: Secondary | ICD-10-CM | POA: Diagnosis not present

## 2017-07-06 DIAGNOSIS — J301 Allergic rhinitis due to pollen: Secondary | ICD-10-CM | POA: Diagnosis not present

## 2017-07-06 DIAGNOSIS — J3081 Allergic rhinitis due to animal (cat) (dog) hair and dander: Secondary | ICD-10-CM | POA: Diagnosis not present

## 2017-07-07 ENCOUNTER — Encounter: Payer: Self-pay | Admitting: Thoracic Surgery (Cardiothoracic Vascular Surgery)

## 2017-07-07 ENCOUNTER — Other Ambulatory Visit: Payer: Medicare Other

## 2017-07-07 ENCOUNTER — Ambulatory Visit (INDEPENDENT_AMBULATORY_CARE_PROVIDER_SITE_OTHER): Payer: Medicare Other | Admitting: Thoracic Surgery (Cardiothoracic Vascular Surgery)

## 2017-07-07 ENCOUNTER — Other Ambulatory Visit: Payer: Self-pay

## 2017-07-07 ENCOUNTER — Ambulatory Visit
Admission: RE | Admit: 2017-07-07 | Discharge: 2017-07-07 | Disposition: A | Discharge status: Home / Self Care | Payer: Medicare Other | Source: Ambulatory Visit | Attending: Thoracic Surgery (Cardiothoracic Vascular Surgery) | Admitting: Thoracic Surgery (Cardiothoracic Vascular Surgery)

## 2017-07-07 VITALS — BP 143/75 | HR 80 | Resp 16 | Ht 62.0 in | Wt 128.0 lb

## 2017-07-07 DIAGNOSIS — C3432 Malignant neoplasm of lower lobe, left bronchus or lung: Secondary | ICD-10-CM | POA: Diagnosis not present

## 2017-07-07 DIAGNOSIS — C3490 Malignant neoplasm of unspecified part of unspecified bronchus or lung: Secondary | ICD-10-CM | POA: Diagnosis not present

## 2017-07-07 DIAGNOSIS — Z09 Encounter for follow-up examination after completed treatment for conditions other than malignant neoplasm: Secondary | ICD-10-CM

## 2017-07-07 NOTE — Progress Notes (Signed)
[address]       [place] [address]             [phone]    HPI: Doris Barton returns for scheduled for your follow-up visit  Doris Barton is a 72 year old woman who had a wedge resection in September 2014 for a stage IA adenocarcinoma with lepidic spread.  There was a small focus of invasive carcinoma.  She is a lifelong non-smoker.  I last saw her in the office in November 2017.  She was doing well at that time with no evidence of recurrent disease.  In the interim since her last visit she is moved to [place], [place].  She has been feeling well.  Her appetite is good.  She denies weight loss.  She denies cough, wheezing, shortness of breath, chest pain.  Denies unusual headaches or visual changes.  Past Medical History:  Diagnosis Date  . Arthritis   . Asthma    last flare 6 mos to year ago  . Blood in urine 3 years ago  . Carcinoma, lung ([place])    Invasive adenocarcinoma status post LLL wedge resection with negative margin  . Headache(784.0)    migraines none in last 10-15 years ago  . Hypertension   . PONV (postoperative nausea and vomiting)     Current Outpatient Medications  Medication Sig Dispense Refill  . losartan (COZAAR) 50 MG tablet Take 1 tablet (50 mg total) by mouth daily. (Patient taking differently: Take 100 mg by mouth daily. ) 90 tablet 3  . EPIPEN 2-PAK 0.3 MG/0.3ML DEVI Inject 0.3 mg into the skin once.      No current facility-administered medications for this visit.     Physical Exam BP (!) 143/75 (BP Location: Right Arm, Patient Position: Sitting, Cuff Size: Large)   Pulse 80   Resp 16   Ht 5\' 2"  (1.575 m)   Wt 128 lb (58.1 kg)   SpO2 98% Comment: ON RA  BMI 23.41 kg/m  Well-appearing 71 year old woman in no acute distress Cervical or subclavicular adenopathy Lungs clear with equal breath sounds bilaterally Cardiac regular rate and rhythm normal S1-S2  Diagnostic Tests: CT CHEST WITHOUT  CONTRAST  TECHNIQUE: Multidetector CT imaging of the chest was performed following the standard protocol without IV contrast.  COMPARISON:  Chest CT June 10, 2016 and June 05, 2015.  FINDINGS: Cardiovascular: There is no demonstrable thoracic aortic aneurysm. Visualized great vessels appear normal except for mild calcification in the proximal right internal carotid artery on this noncontrast enhanced study. There are foci of aortic atherosclerotic calcification. There are occasional foci of coronary artery calcification. Pericardium is not appreciably thickened.  Mediastinum/Nodes: Thyroid appears unremarkable. There are stable subcentimeter axillary lymph nodes. There are several subcentimeter mediastinal lymph nodes anterior to the carina, stable. The largest of these lymph nodes measures 9 x 6 mm, stable. There is no adenopathy by size criteria evident on this study. No esophageal lesions are appreciable.  Lungs/Pleura: The patient has had wedge resection procedure in the left lower lobe with stable scarring in this area. There is mild scarring in the inferior lingula as well as in each apex. There is no edema or consolidation. There is no parenchymal lung mass or nodular appearing lesion. No pleural effusion is evident. No new pleural thickening is evident beyond mild apparent postoperative change on the left which is stable.  Upper Abdomen: There is atherosclerotic calcification in the upper abdominal aorta. There is a persistent  9 mm presumed cyst in the right lobe of the liver anteriorly. Visualized upper abdominal structures otherwise appear unremarkable and stable.  Musculoskeletal: There is a stable small hemangioma in the T11 vertebral body. No blastic or lytic bone lesions.  IMPRESSION: 1. Areas of postoperative scarring on the left, stable. No edema or consolidation. No parenchymal lung nodule or mass evident.  2. Stable subcentimeter mediastinal  lymph nodes. No adenopathy by size criteria.  3.  Aortic atherosclerosis.  Mild coronary artery calcification.  Aortic Atherosclerosis (ICD10-I70.0).   Electronically Signed   By: Lowella Grip III M.D.   On: 07/07/2017 12:07 I personally reviewed the CT chest and concur with the findings noted above  Impression: Doris Barton is a 71 year old non-smoker who had a stage IA adenocarcinoma with lepidic spread resected in 2014.  She is now 4 years out from surgery with no evidence of recurrent disease.  Hypertension-blood pressure was elevated at 128 systolic today.  She says that her blood pressure medications were recently changed.  That is being followed by her primary care physician Dr. Jeoffrey Massed in [place], [place].  Plan: Return in 1 year with CT chest for 5-year follow-up visit.  Melrose Nakayama, MD [facility] [phone]

## 2017-07-21 IMAGING — CT CT CHEST W/O CM
3 of 4 series · 17 of 30 positions shown, 19 images · non-contrast
Comparison: 06/05/2015

CLINICAL DATA: History of lung cancer with prior left lower lobe
wedge resection.

EXAM:
CT CHEST WITHOUT CONTRAST
TECHNIQUE: Multidetector CT imaging of the chest was performed following the
standard protocol without IV contrast.

[Series 3: chest w/o · axial · non-contrast · 0.64mm/px · z∈[-238,+0]mm · 7 of 127 slices shown, 9 images]
[im 16/127  mediastinal]
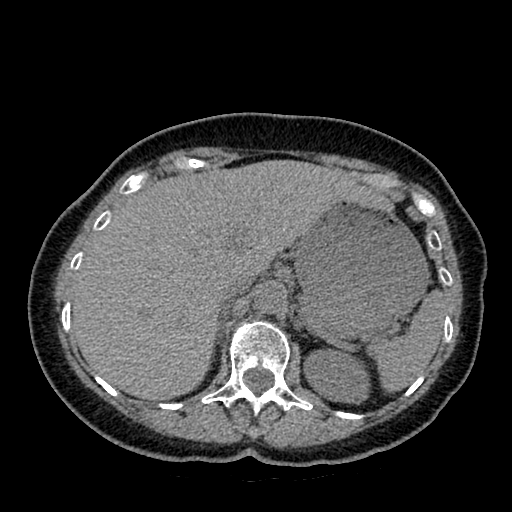
[im 16/127  lung]
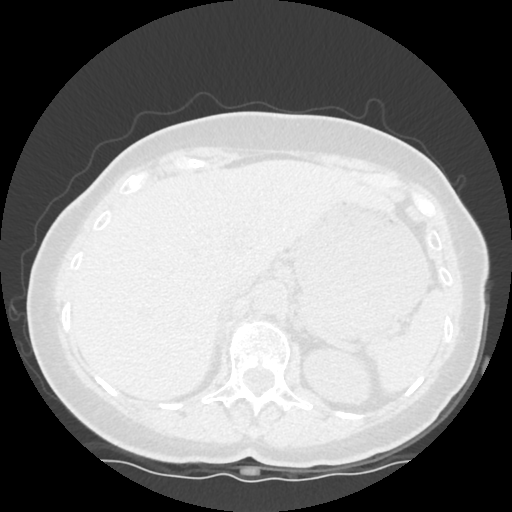
[im 32/127  lung]
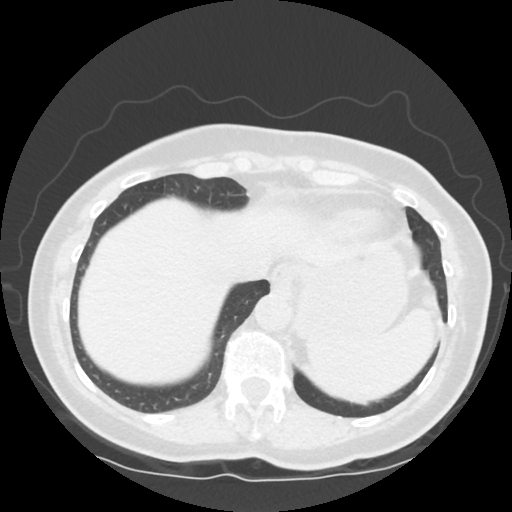
[im 48/127  lung]
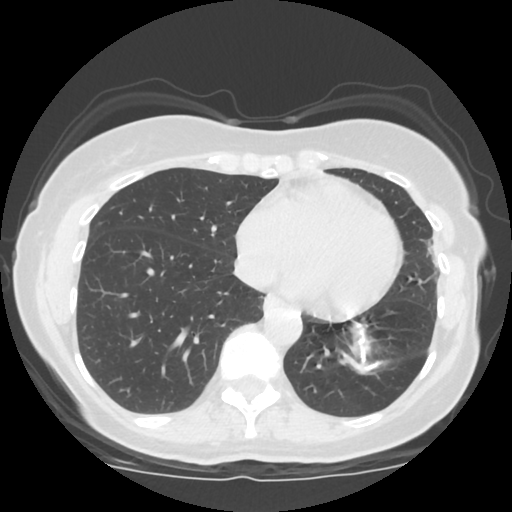
[im 64/127  lung]
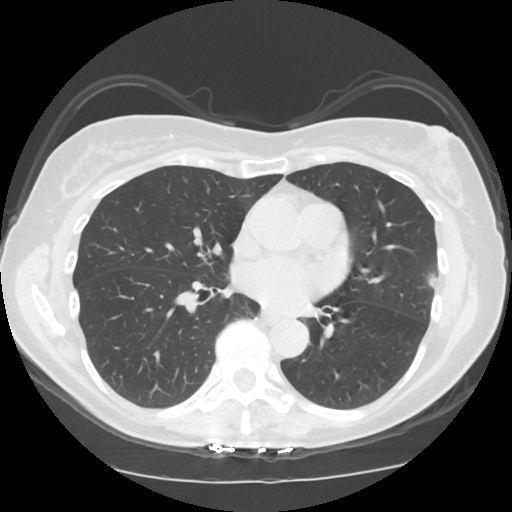
[im 79/127  mediastinal]
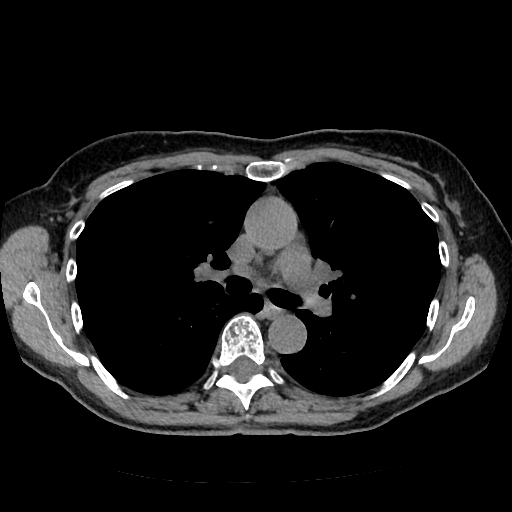
[im 79/127  lung]
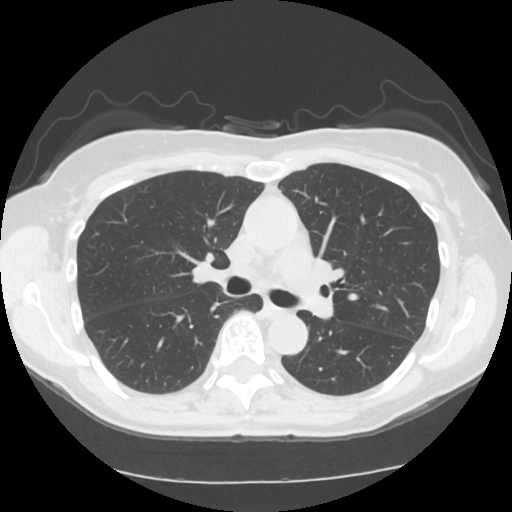
[im 95/127  lung]
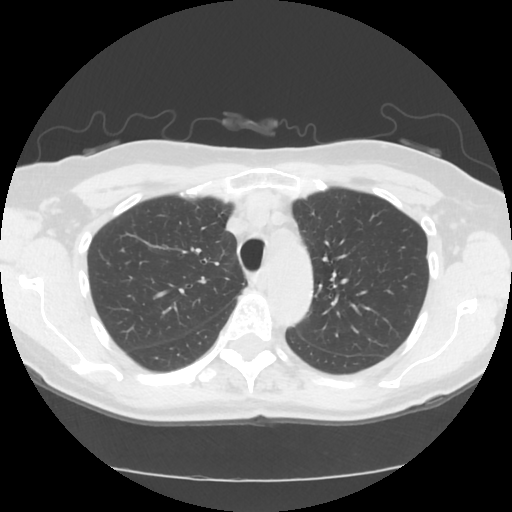
[im 111/127  lung]
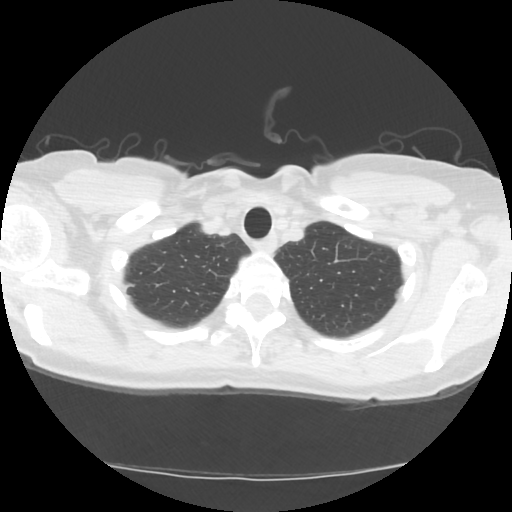

[Series 4: lung windows · axial · 0.64mm/px · z∈[-238,+0]mm · 7 of 127 slices shown]
[im 16/127  lung]
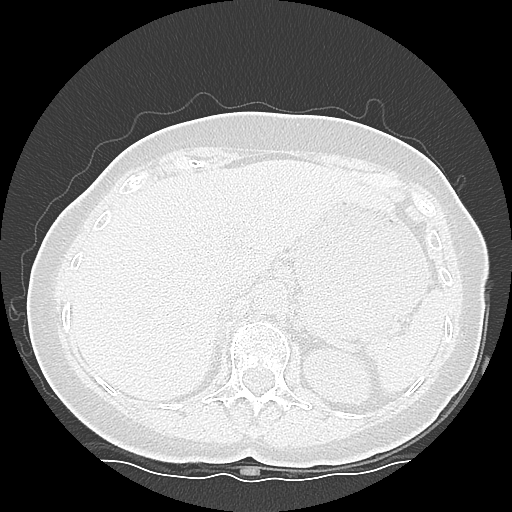
[im 32/127  lung]
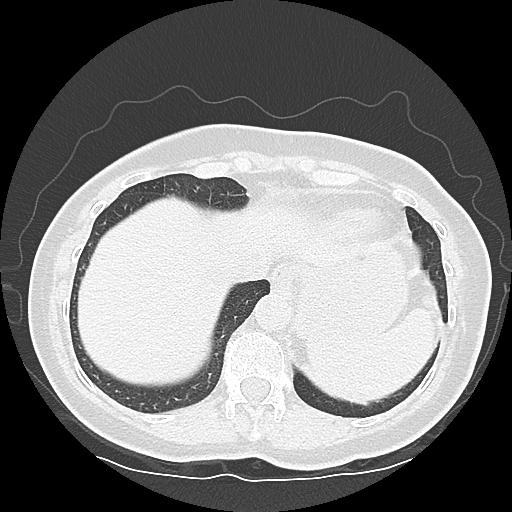
[im 48/127  lung]
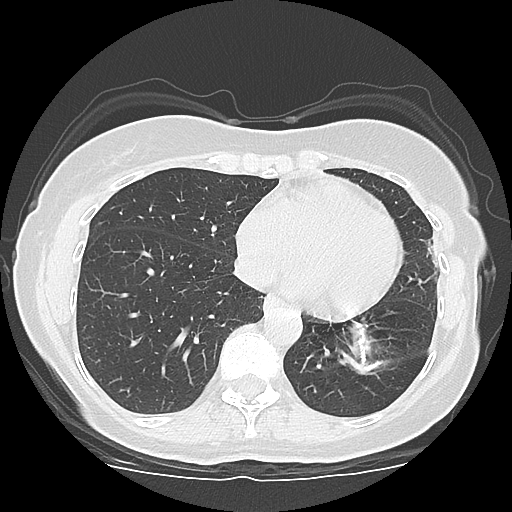
[im 64/127  lung]
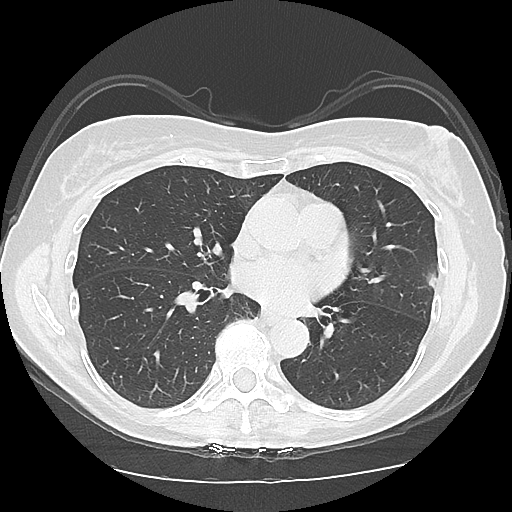
[im 79/127  lung]
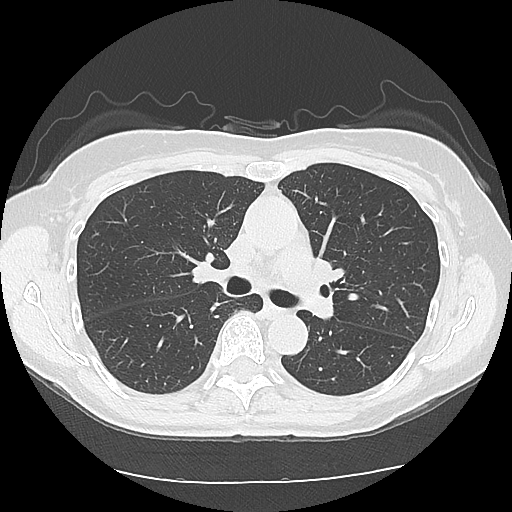
[im 95/127  lung]
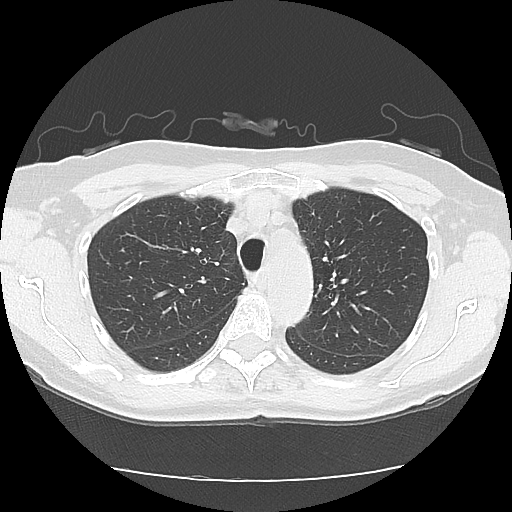
[im 111/127  lung]
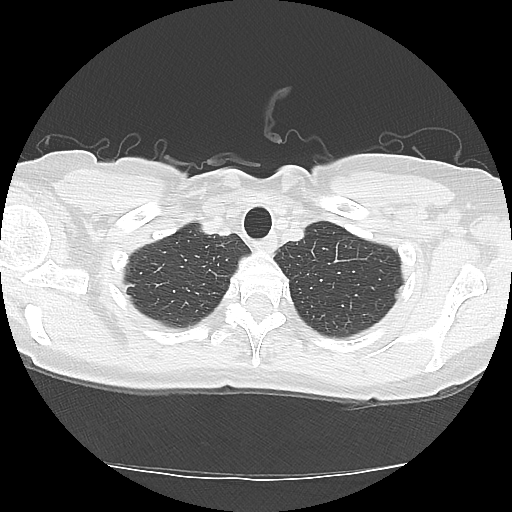

[Series 602: sagittal body · sagittal · 0.64mm/px · 3 of 132 slices shown]
[im 15/132  mediastinal]
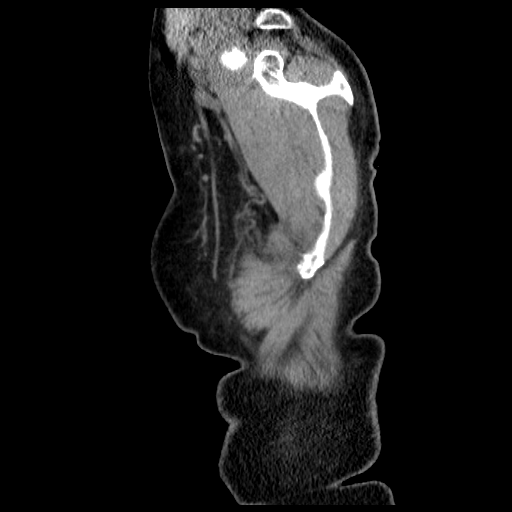
[im 30/132  mediastinal]
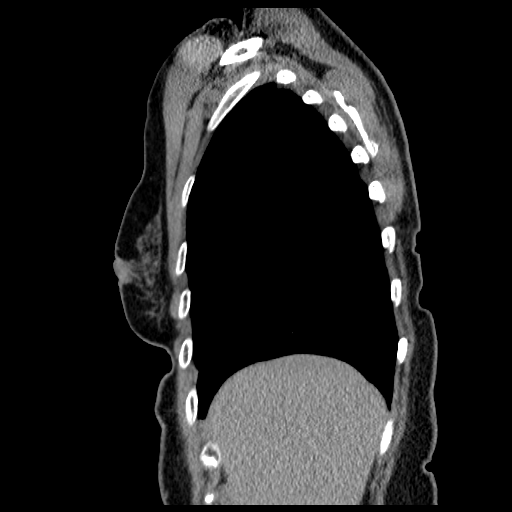
[im 44/132  mediastinal]
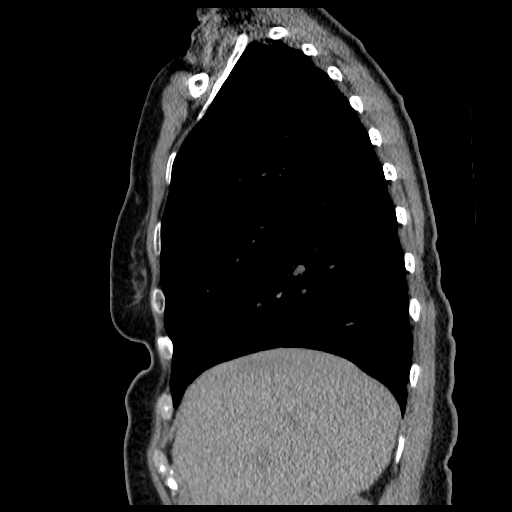

[17 of 30 positions shown; findings below may reference images not displayed]

FINDINGS: Cardiovascular: The heart size is normal. No pericardial effusion.
Atherosclerotic calcification is noted in the wall of the thoracic
aorta.

Mediastinum/Nodes: No mediastinal lymphadenopathy. No evidence for
gross hilar lymphadenopathy although assessment is limited by the
lack of intravenous contrast on today's study. The esophagus has
normal imaging features. There is no axillary lymphadenopathy.

Lungs/Pleura: Biapical pleural-parenchymal scarring is stable
postoperative changes from prior wedge resection again noted left
lower lobe. Pleural-based area of pleural-parenchymal all thickening
in the lateral lingula is similar to previous and likely represents
scar. No suspicious pulmonary nodule or mass. No focal airspace
consolidation. No pulmonary edema or pleural effusion.

Upper Abdomen: Stable 9 mm hypo attenuating lesion in the dome of
liver, likely a cyst.

Musculoskeletal: Bone windows reveal no worrisome lytic or sclerotic
osseous lesions.
IMPRESSION: 1. Stable appearance postsurgical change left lower lobe.
2. No new or progressive findings on today's exam.

## 2017-08-03 DIAGNOSIS — J3081 Allergic rhinitis due to animal (cat) (dog) hair and dander: Secondary | ICD-10-CM | POA: Diagnosis not present

## 2017-08-03 DIAGNOSIS — J3089 Other allergic rhinitis: Secondary | ICD-10-CM | POA: Diagnosis not present

## 2017-08-03 DIAGNOSIS — J301 Allergic rhinitis due to pollen: Secondary | ICD-10-CM | POA: Diagnosis not present

## 2018-06-16 ENCOUNTER — Other Ambulatory Visit: Payer: Self-pay | Admitting: *Deleted

## 2018-06-16 DIAGNOSIS — C3432 Malignant neoplasm of lower lobe, left bronchus or lung: Secondary | ICD-10-CM

## 2018-08-03 ENCOUNTER — Ambulatory Visit
Admission: RE | Admit: 2018-08-03 | Discharge: 2018-08-03 | Disposition: A | Discharge status: Home / Self Care | Payer: Medicare Other | Source: Ambulatory Visit | Attending: Thoracic Surgery (Cardiothoracic Vascular Surgery) | Admitting: Thoracic Surgery (Cardiothoracic Vascular Surgery)

## 2018-08-03 ENCOUNTER — Other Ambulatory Visit: Payer: Self-pay

## 2018-08-03 ENCOUNTER — Encounter: Payer: Self-pay | Admitting: Thoracic Surgery (Cardiothoracic Vascular Surgery)

## 2018-08-03 ENCOUNTER — Ambulatory Visit (INDEPENDENT_AMBULATORY_CARE_PROVIDER_SITE_OTHER): Payer: Medicare Other | Admitting: Thoracic Surgery (Cardiothoracic Vascular Surgery)

## 2018-08-03 VITALS — BP 114/69 | HR 75 | Resp 18 | Ht 62.0 in | Wt 133.0 lb

## 2018-08-03 DIAGNOSIS — C3432 Malignant neoplasm of lower lobe, left bronchus or lung: Secondary | ICD-10-CM

## 2018-08-03 NOTE — Progress Notes (Signed)
[address]       Doris Barton,Doris Barton [address]             [phone]     HPI: [name] returns for a scheduled follow-up appointment  Doris Barton is a 72 year old woman who is a lifelong non-smoker.  She has a history of hypertension.  She was found to have a groundglass lung nodule back in 2012.  She also had some mediastinal adenopathy.  The mediastinal nodes showed only sinus histiocytosis.  The left lower lobe lung nodule was stable initially, but in 2014 showed some more density.  I did a wedge resection and node sampling in September 2014.  It turned out to be a stage Ia adenocarcinoma with lepidic spread.  There was a small invasive component.  She has been followed since then.  Last saw her in the office a year ago.  She was doing well at that time with no respiratory issues and no evidence of recurrent disease.  She feels well.  She lives up at [facility] now.  She remains physically active.  She is not having any problems with chest pain, shortness of breath, cough, or wheezing.  Her appetite is good and her weight is stable.   Current Outpatient Medications  Medication Sig Dispense Refill  . escitalopram (LEXAPRO) 10 MG tablet Take 10 mg by mouth daily.    Marland Kitchen losartan-hydrochlorothiazide (HYZAAR) 100-25 MG tablet Take 1 tablet by mouth daily.    . Multiple Vitamins-Minerals (MULTIVITAMIN WITH MINERALS) tablet Take 1 tablet by mouth daily.     No current facility-administered medications for this visit.     Physical Exam BP 114/69 (BP Location: Left Arm, Patient Position: Sitting, Cuff Size: Normal)   Pulse 75   Resp 18   Ht 5\' 2"  (1.575 m)   Wt 133 lb (60.3 kg)   SpO2 94% Comment: Ra  BMI 24.48 kg/m  72 year old woman in no acute distress Alert and oriented x3 with no focal deficits No cervical or supraclavicular adenopathy Lungs clear with equal breath sounds bilaterally Cardiac regular rate and rhythm normal S1 and S2  Diagnostic Tests: CT  CHEST WITHOUT CONTRAST  TECHNIQUE: Multidetector CT imaging of the chest was performed following the standard protocol without IV contrast.  COMPARISON:  07/07/2017  FINDINGS: Cardiovascular: Heart is normal in size.  No pericardial effusion.  No evidence of thoracic aortic aneurysm. Mild atherosclerotic calcifications of the aortic arch.  Mediastinum/Nodes: No suspicious mediastinal lymphadenopathy.  Visualized thyroid is unremarkable.  Lungs/Pleura: Mild biapical pleural-parenchymal scarring.  Status post left lower lobe wedge resection.  No suspicious pulmonary nodules.  No focal consolidation. Mild subpleural reticulation along the lateral aspect of the lingula.  No pleural effusion or pneumothorax.  Upper Abdomen: Visualized upper abdomen is notable for a 10 mm probable right hepatic lobe cyst (series 2/image 128) and vascular calcifications.  Musculoskeletal: Mild degenerative changes of the visualized thoracolumbar spine.  IMPRESSION: Status post left lower lobe wedge resection.  No evidence of recurrent or metastatic disease.  Aortic Atherosclerosis (ICD10-I70.0).   Electronically Signed   By: Julian Hy M.D.   On: 08/03/2018 13:51 I personally reviewed the CT images and concur with the findings noted above  Impression: Doris Barton is a 72 year old woman who had a wedge resection for a stage IA adenocarcinoma with lepidic spread 5 years ago.  Her CT and physical exam today showed no evidence of recurrent disease.  She is a lifelong non-smoker.  At this point she does not need any additional follow-up for this low-grade adenocarcinoma.  Her CT does not show any other suspicious findings.  Plan: I will be happy to see Doris Barton back at any time in the future if I can be of any further assistance with her care.  Doris Nakayama, MD [facility] [phone]

## 2020-08-06 DIAGNOSIS — J3089 Other allergic rhinitis: Secondary | ICD-10-CM | POA: Diagnosis not present

## 2020-08-06 DIAGNOSIS — J3081 Allergic rhinitis due to animal (cat) (dog) hair and dander: Secondary | ICD-10-CM | POA: Diagnosis not present

## 2020-08-06 DIAGNOSIS — J301 Allergic rhinitis due to pollen: Secondary | ICD-10-CM | POA: Diagnosis not present

## 2020-08-16 DIAGNOSIS — G43909 Migraine, unspecified, not intractable, without status migrainosus: Secondary | ICD-10-CM | POA: Diagnosis not present

## 2020-08-16 DIAGNOSIS — I10 Essential (primary) hypertension: Secondary | ICD-10-CM | POA: Diagnosis not present

## 2020-08-16 DIAGNOSIS — Z636 Dependent relative needing care at home: Secondary | ICD-10-CM | POA: Diagnosis not present

## 2020-08-28 DIAGNOSIS — J3081 Allergic rhinitis due to animal (cat) (dog) hair and dander: Secondary | ICD-10-CM | POA: Diagnosis not present

## 2020-08-28 DIAGNOSIS — J3089 Other allergic rhinitis: Secondary | ICD-10-CM | POA: Diagnosis not present

## 2020-08-28 DIAGNOSIS — J301 Allergic rhinitis due to pollen: Secondary | ICD-10-CM | POA: Diagnosis not present

## 2020-09-11 DIAGNOSIS — J3089 Other allergic rhinitis: Secondary | ICD-10-CM | POA: Diagnosis not present

## 2020-09-11 DIAGNOSIS — J3081 Allergic rhinitis due to animal (cat) (dog) hair and dander: Secondary | ICD-10-CM | POA: Diagnosis not present

## 2020-09-11 DIAGNOSIS — J301 Allergic rhinitis due to pollen: Secondary | ICD-10-CM | POA: Diagnosis not present

## 2020-09-25 DIAGNOSIS — J301 Allergic rhinitis due to pollen: Secondary | ICD-10-CM | POA: Diagnosis not present

## 2020-09-25 DIAGNOSIS — J3081 Allergic rhinitis due to animal (cat) (dog) hair and dander: Secondary | ICD-10-CM | POA: Diagnosis not present

## 2020-09-25 DIAGNOSIS — J3089 Other allergic rhinitis: Secondary | ICD-10-CM | POA: Diagnosis not present

## 2020-10-02 DIAGNOSIS — J3081 Allergic rhinitis due to animal (cat) (dog) hair and dander: Secondary | ICD-10-CM | POA: Diagnosis not present

## 2020-10-02 DIAGNOSIS — J3089 Other allergic rhinitis: Secondary | ICD-10-CM | POA: Diagnosis not present

## 2020-10-02 DIAGNOSIS — J301 Allergic rhinitis due to pollen: Secondary | ICD-10-CM | POA: Diagnosis not present

## 2020-10-09 DIAGNOSIS — J3089 Other allergic rhinitis: Secondary | ICD-10-CM | POA: Diagnosis not present

## 2020-10-09 DIAGNOSIS — J301 Allergic rhinitis due to pollen: Secondary | ICD-10-CM | POA: Diagnosis not present

## 2020-10-09 DIAGNOSIS — J3081 Allergic rhinitis due to animal (cat) (dog) hair and dander: Secondary | ICD-10-CM | POA: Diagnosis not present

## 2020-10-23 DIAGNOSIS — J3081 Allergic rhinitis due to animal (cat) (dog) hair and dander: Secondary | ICD-10-CM | POA: Diagnosis not present

## 2020-10-23 DIAGNOSIS — J301 Allergic rhinitis due to pollen: Secondary | ICD-10-CM | POA: Diagnosis not present

## 2020-10-23 DIAGNOSIS — J3089 Other allergic rhinitis: Secondary | ICD-10-CM | POA: Diagnosis not present

## 2020-11-06 DIAGNOSIS — J3081 Allergic rhinitis due to animal (cat) (dog) hair and dander: Secondary | ICD-10-CM | POA: Diagnosis not present

## 2020-11-06 DIAGNOSIS — J301 Allergic rhinitis due to pollen: Secondary | ICD-10-CM | POA: Diagnosis not present

## 2020-11-06 DIAGNOSIS — J3089 Other allergic rhinitis: Secondary | ICD-10-CM | POA: Diagnosis not present

## 2020-11-20 DIAGNOSIS — J3081 Allergic rhinitis due to animal (cat) (dog) hair and dander: Secondary | ICD-10-CM | POA: Diagnosis not present

## 2020-11-20 DIAGNOSIS — J301 Allergic rhinitis due to pollen: Secondary | ICD-10-CM | POA: Diagnosis not present

## 2020-11-20 DIAGNOSIS — Z23 Encounter for immunization: Secondary | ICD-10-CM | POA: Diagnosis not present

## 2020-11-20 DIAGNOSIS — J3089 Other allergic rhinitis: Secondary | ICD-10-CM | POA: Diagnosis not present

## 2020-12-04 DIAGNOSIS — J301 Allergic rhinitis due to pollen: Secondary | ICD-10-CM | POA: Diagnosis not present

## 2020-12-04 DIAGNOSIS — J3089 Other allergic rhinitis: Secondary | ICD-10-CM | POA: Diagnosis not present

## 2020-12-04 DIAGNOSIS — J3081 Allergic rhinitis due to animal (cat) (dog) hair and dander: Secondary | ICD-10-CM | POA: Diagnosis not present

## 2020-12-18 DIAGNOSIS — J3081 Allergic rhinitis due to animal (cat) (dog) hair and dander: Secondary | ICD-10-CM | POA: Diagnosis not present

## 2020-12-18 DIAGNOSIS — J301 Allergic rhinitis due to pollen: Secondary | ICD-10-CM | POA: Diagnosis not present

## 2020-12-18 DIAGNOSIS — J3089 Other allergic rhinitis: Secondary | ICD-10-CM | POA: Diagnosis not present

## 2021-01-01 DIAGNOSIS — J3081 Allergic rhinitis due to animal (cat) (dog) hair and dander: Secondary | ICD-10-CM | POA: Diagnosis not present

## 2021-01-01 DIAGNOSIS — J3089 Other allergic rhinitis: Secondary | ICD-10-CM | POA: Diagnosis not present

## 2021-01-01 DIAGNOSIS — J301 Allergic rhinitis due to pollen: Secondary | ICD-10-CM | POA: Diagnosis not present

## 2021-01-28 DIAGNOSIS — J301 Allergic rhinitis due to pollen: Secondary | ICD-10-CM | POA: Diagnosis not present

## 2021-01-28 DIAGNOSIS — J3089 Other allergic rhinitis: Secondary | ICD-10-CM | POA: Diagnosis not present

## 2021-01-28 DIAGNOSIS — J3081 Allergic rhinitis due to animal (cat) (dog) hair and dander: Secondary | ICD-10-CM | POA: Diagnosis not present

## 2021-02-11 DIAGNOSIS — J3089 Other allergic rhinitis: Secondary | ICD-10-CM | POA: Diagnosis not present

## 2021-02-11 DIAGNOSIS — J301 Allergic rhinitis due to pollen: Secondary | ICD-10-CM | POA: Diagnosis not present

## 2021-02-11 DIAGNOSIS — J3081 Allergic rhinitis due to animal (cat) (dog) hair and dander: Secondary | ICD-10-CM | POA: Diagnosis not present

## 2021-02-13 DIAGNOSIS — L989 Disorder of the skin and subcutaneous tissue, unspecified: Secondary | ICD-10-CM | POA: Diagnosis not present

## 2021-02-13 DIAGNOSIS — G43909 Migraine, unspecified, not intractable, without status migrainosus: Secondary | ICD-10-CM | POA: Diagnosis not present

## 2021-02-13 DIAGNOSIS — I10 Essential (primary) hypertension: Secondary | ICD-10-CM | POA: Diagnosis not present

## 2021-02-25 DIAGNOSIS — J301 Allergic rhinitis due to pollen: Secondary | ICD-10-CM | POA: Diagnosis not present

## 2021-02-25 DIAGNOSIS — J3089 Other allergic rhinitis: Secondary | ICD-10-CM | POA: Diagnosis not present

## 2021-02-25 DIAGNOSIS — J3081 Allergic rhinitis due to animal (cat) (dog) hair and dander: Secondary | ICD-10-CM | POA: Diagnosis not present

## 2021-02-26 DIAGNOSIS — I10 Essential (primary) hypertension: Secondary | ICD-10-CM | POA: Diagnosis not present

## 2021-03-01 DIAGNOSIS — J3089 Other allergic rhinitis: Secondary | ICD-10-CM | POA: Diagnosis not present

## 2021-03-01 DIAGNOSIS — J3081 Allergic rhinitis due to animal (cat) (dog) hair and dander: Secondary | ICD-10-CM | POA: Diagnosis not present

## 2021-03-01 DIAGNOSIS — J301 Allergic rhinitis due to pollen: Secondary | ICD-10-CM | POA: Diagnosis not present

## 2021-03-11 DIAGNOSIS — J3089 Other allergic rhinitis: Secondary | ICD-10-CM | POA: Diagnosis not present

## 2021-03-11 DIAGNOSIS — J301 Allergic rhinitis due to pollen: Secondary | ICD-10-CM | POA: Diagnosis not present

## 2021-03-11 DIAGNOSIS — J3081 Allergic rhinitis due to animal (cat) (dog) hair and dander: Secondary | ICD-10-CM | POA: Diagnosis not present

## 2021-03-12 DIAGNOSIS — H43393 Other vitreous opacities, bilateral: Secondary | ICD-10-CM | POA: Diagnosis not present

## 2021-03-12 DIAGNOSIS — H524 Presbyopia: Secondary | ICD-10-CM | POA: Diagnosis not present

## 2021-03-12 DIAGNOSIS — H5213 Myopia, bilateral: Secondary | ICD-10-CM | POA: Diagnosis not present

## 2021-03-12 DIAGNOSIS — H25813 Combined forms of age-related cataract, bilateral: Secondary | ICD-10-CM | POA: Diagnosis not present

## 2021-03-25 DIAGNOSIS — J301 Allergic rhinitis due to pollen: Secondary | ICD-10-CM | POA: Diagnosis not present

## 2021-03-25 DIAGNOSIS — J3089 Other allergic rhinitis: Secondary | ICD-10-CM | POA: Diagnosis not present

## 2021-03-25 DIAGNOSIS — J3081 Allergic rhinitis due to animal (cat) (dog) hair and dander: Secondary | ICD-10-CM | POA: Diagnosis not present

## 2021-03-26 DIAGNOSIS — D3701 Neoplasm of uncertain behavior of lip: Secondary | ICD-10-CM | POA: Diagnosis not present

## 2021-03-26 DIAGNOSIS — D030 Melanoma in situ of lip: Secondary | ICD-10-CM | POA: Diagnosis not present

## 2021-03-26 DIAGNOSIS — L280 Lichen simplex chronicus: Secondary | ICD-10-CM | POA: Diagnosis not present

## 2021-03-26 DIAGNOSIS — D3614 Benign neoplasm of peripheral nerves and autonomic nervous system of thorax: Secondary | ICD-10-CM | POA: Diagnosis not present

## 2021-03-26 DIAGNOSIS — R202 Paresthesia of skin: Secondary | ICD-10-CM | POA: Diagnosis not present

## 2021-03-26 DIAGNOSIS — L538 Other specified erythematous conditions: Secondary | ICD-10-CM | POA: Diagnosis not present

## 2021-04-02 DIAGNOSIS — Z20822 Contact with and (suspected) exposure to covid-19: Secondary | ICD-10-CM | POA: Diagnosis not present

## 2021-04-04 DIAGNOSIS — Z1231 Encounter for screening mammogram for malignant neoplasm of breast: Secondary | ICD-10-CM | POA: Diagnosis not present

## 2021-04-04 DIAGNOSIS — Z20822 Contact with and (suspected) exposure to covid-19: Secondary | ICD-10-CM | POA: Diagnosis not present

## 2021-04-09 DIAGNOSIS — J301 Allergic rhinitis due to pollen: Secondary | ICD-10-CM | POA: Diagnosis not present

## 2021-04-09 DIAGNOSIS — J3089 Other allergic rhinitis: Secondary | ICD-10-CM | POA: Diagnosis not present

## 2021-04-09 DIAGNOSIS — J3081 Allergic rhinitis due to animal (cat) (dog) hair and dander: Secondary | ICD-10-CM | POA: Diagnosis not present

## 2021-04-11 DIAGNOSIS — Z23 Encounter for immunization: Secondary | ICD-10-CM | POA: Diagnosis not present

## 2021-04-13 DIAGNOSIS — Z23 Encounter for immunization: Secondary | ICD-10-CM | POA: Diagnosis not present

## 2021-04-19 DIAGNOSIS — D030 Melanoma in situ of lip: Secondary | ICD-10-CM | POA: Diagnosis not present

## 2021-04-19 DIAGNOSIS — C000 Malignant neoplasm of external upper lip: Secondary | ICD-10-CM | POA: Diagnosis not present

## 2021-04-23 DIAGNOSIS — J3089 Other allergic rhinitis: Secondary | ICD-10-CM | POA: Diagnosis not present

## 2021-04-23 DIAGNOSIS — J301 Allergic rhinitis due to pollen: Secondary | ICD-10-CM | POA: Diagnosis not present

## 2021-04-23 DIAGNOSIS — J3081 Allergic rhinitis due to animal (cat) (dog) hair and dander: Secondary | ICD-10-CM | POA: Diagnosis not present

## 2021-05-04 DIAGNOSIS — Z20822 Contact with and (suspected) exposure to covid-19: Secondary | ICD-10-CM | POA: Diagnosis not present

## 2021-05-07 DIAGNOSIS — J3089 Other allergic rhinitis: Secondary | ICD-10-CM | POA: Diagnosis not present

## 2021-05-07 DIAGNOSIS — J301 Allergic rhinitis due to pollen: Secondary | ICD-10-CM | POA: Diagnosis not present

## 2021-05-07 DIAGNOSIS — J3081 Allergic rhinitis due to animal (cat) (dog) hair and dander: Secondary | ICD-10-CM | POA: Diagnosis not present

## 2021-05-10 DIAGNOSIS — H25811 Combined forms of age-related cataract, right eye: Secondary | ICD-10-CM | POA: Diagnosis not present

## 2021-05-10 DIAGNOSIS — H25812 Combined forms of age-related cataract, left eye: Secondary | ICD-10-CM | POA: Diagnosis not present

## 2021-05-10 DIAGNOSIS — H43393 Other vitreous opacities, bilateral: Secondary | ICD-10-CM | POA: Diagnosis not present

## 2021-05-21 DIAGNOSIS — J3081 Allergic rhinitis due to animal (cat) (dog) hair and dander: Secondary | ICD-10-CM | POA: Diagnosis not present

## 2021-05-21 DIAGNOSIS — J3089 Other allergic rhinitis: Secondary | ICD-10-CM | POA: Diagnosis not present

## 2021-05-21 DIAGNOSIS — J301 Allergic rhinitis due to pollen: Secondary | ICD-10-CM | POA: Diagnosis not present

## 2021-05-28 DIAGNOSIS — L280 Lichen simplex chronicus: Secondary | ICD-10-CM | POA: Diagnosis not present

## 2021-05-28 DIAGNOSIS — R202 Paresthesia of skin: Secondary | ICD-10-CM | POA: Diagnosis not present

## 2021-05-28 DIAGNOSIS — D225 Melanocytic nevi of trunk: Secondary | ICD-10-CM | POA: Diagnosis not present

## 2021-05-28 DIAGNOSIS — D3614 Benign neoplasm of peripheral nerves and autonomic nervous system of thorax: Secondary | ICD-10-CM | POA: Diagnosis not present

## 2021-05-28 DIAGNOSIS — I8391 Asymptomatic varicose veins of right lower extremity: Secondary | ICD-10-CM | POA: Diagnosis not present

## 2021-05-28 DIAGNOSIS — Z1283 Encounter for screening for malignant neoplasm of skin: Secondary | ICD-10-CM | POA: Diagnosis not present

## 2021-05-28 DIAGNOSIS — L578 Other skin changes due to chronic exposure to nonionizing radiation: Secondary | ICD-10-CM | POA: Diagnosis not present

## 2021-06-04 DIAGNOSIS — Z20822 Contact with and (suspected) exposure to covid-19: Secondary | ICD-10-CM | POA: Diagnosis not present

## 2021-06-04 DIAGNOSIS — J3089 Other allergic rhinitis: Secondary | ICD-10-CM | POA: Diagnosis not present

## 2021-06-04 DIAGNOSIS — J301 Allergic rhinitis due to pollen: Secondary | ICD-10-CM | POA: Diagnosis not present

## 2021-06-04 DIAGNOSIS — J3081 Allergic rhinitis due to animal (cat) (dog) hair and dander: Secondary | ICD-10-CM | POA: Diagnosis not present

## 2021-06-18 DIAGNOSIS — J301 Allergic rhinitis due to pollen: Secondary | ICD-10-CM | POA: Diagnosis not present

## 2021-06-18 DIAGNOSIS — J3089 Other allergic rhinitis: Secondary | ICD-10-CM | POA: Diagnosis not present

## 2021-06-18 DIAGNOSIS — J3081 Allergic rhinitis due to animal (cat) (dog) hair and dander: Secondary | ICD-10-CM | POA: Diagnosis not present

## 2021-06-20 DIAGNOSIS — D030 Melanoma in situ of lip: Secondary | ICD-10-CM | POA: Diagnosis not present

## 2021-06-20 DIAGNOSIS — D3701 Neoplasm of uncertain behavior of lip: Secondary | ICD-10-CM | POA: Diagnosis not present

## 2021-06-24 DIAGNOSIS — H1010 Acute atopic conjunctivitis, unspecified eye: Secondary | ICD-10-CM | POA: Diagnosis not present

## 2021-06-24 DIAGNOSIS — J3081 Allergic rhinitis due to animal (cat) (dog) hair and dander: Secondary | ICD-10-CM | POA: Diagnosis not present

## 2021-06-24 DIAGNOSIS — J3089 Other allergic rhinitis: Secondary | ICD-10-CM | POA: Diagnosis not present

## 2021-06-24 DIAGNOSIS — J301 Allergic rhinitis due to pollen: Secondary | ICD-10-CM | POA: Diagnosis not present

## 2021-07-01 DIAGNOSIS — J3081 Allergic rhinitis due to animal (cat) (dog) hair and dander: Secondary | ICD-10-CM | POA: Diagnosis not present

## 2021-07-01 DIAGNOSIS — J301 Allergic rhinitis due to pollen: Secondary | ICD-10-CM | POA: Diagnosis not present

## 2021-07-01 DIAGNOSIS — J3089 Other allergic rhinitis: Secondary | ICD-10-CM | POA: Diagnosis not present

## 2021-07-02 DIAGNOSIS — Z48817 Encounter for surgical aftercare following surgery on the skin and subcutaneous tissue: Secondary | ICD-10-CM | POA: Diagnosis not present

## 2021-07-06 DIAGNOSIS — H25812 Combined forms of age-related cataract, left eye: Secondary | ICD-10-CM | POA: Diagnosis not present

## 2021-07-09 DIAGNOSIS — J301 Allergic rhinitis due to pollen: Secondary | ICD-10-CM | POA: Diagnosis not present

## 2021-07-09 DIAGNOSIS — J3089 Other allergic rhinitis: Secondary | ICD-10-CM | POA: Diagnosis not present

## 2021-07-09 DIAGNOSIS — J3081 Allergic rhinitis due to animal (cat) (dog) hair and dander: Secondary | ICD-10-CM | POA: Diagnosis not present

## 2021-07-15 DIAGNOSIS — J301 Allergic rhinitis due to pollen: Secondary | ICD-10-CM | POA: Diagnosis not present

## 2021-07-15 DIAGNOSIS — J3089 Other allergic rhinitis: Secondary | ICD-10-CM | POA: Diagnosis not present

## 2021-07-15 DIAGNOSIS — J3081 Allergic rhinitis due to animal (cat) (dog) hair and dander: Secondary | ICD-10-CM | POA: Diagnosis not present

## 2021-07-30 DIAGNOSIS — J3081 Allergic rhinitis due to animal (cat) (dog) hair and dander: Secondary | ICD-10-CM | POA: Diagnosis not present

## 2021-07-30 DIAGNOSIS — J301 Allergic rhinitis due to pollen: Secondary | ICD-10-CM | POA: Diagnosis not present

## 2021-07-30 DIAGNOSIS — J3089 Other allergic rhinitis: Secondary | ICD-10-CM | POA: Diagnosis not present

## 2021-08-06 DIAGNOSIS — H25812 Combined forms of age-related cataract, left eye: Secondary | ICD-10-CM | POA: Diagnosis not present

## 2021-08-07 DIAGNOSIS — H25811 Combined forms of age-related cataract, right eye: Secondary | ICD-10-CM | POA: Diagnosis not present

## 2021-08-13 DIAGNOSIS — J301 Allergic rhinitis due to pollen: Secondary | ICD-10-CM | POA: Diagnosis not present

## 2021-08-13 DIAGNOSIS — J3089 Other allergic rhinitis: Secondary | ICD-10-CM | POA: Diagnosis not present

## 2021-08-13 DIAGNOSIS — J3081 Allergic rhinitis due to animal (cat) (dog) hair and dander: Secondary | ICD-10-CM | POA: Diagnosis not present

## 2021-08-19 DIAGNOSIS — Z638 Other specified problems related to primary support group: Secondary | ICD-10-CM | POA: Diagnosis not present

## 2021-08-19 DIAGNOSIS — I10 Essential (primary) hypertension: Secondary | ICD-10-CM | POA: Diagnosis not present

## 2021-08-20 DIAGNOSIS — H25811 Combined forms of age-related cataract, right eye: Secondary | ICD-10-CM | POA: Diagnosis not present

## 2021-08-26 DIAGNOSIS — J301 Allergic rhinitis due to pollen: Secondary | ICD-10-CM | POA: Diagnosis not present

## 2021-08-26 DIAGNOSIS — J3081 Allergic rhinitis due to animal (cat) (dog) hair and dander: Secondary | ICD-10-CM | POA: Diagnosis not present

## 2021-08-26 DIAGNOSIS — J3089 Other allergic rhinitis: Secondary | ICD-10-CM | POA: Diagnosis not present

## 2021-08-27 DIAGNOSIS — L905 Scar conditions and fibrosis of skin: Secondary | ICD-10-CM | POA: Diagnosis not present

## 2021-08-27 DIAGNOSIS — R202 Paresthesia of skin: Secondary | ICD-10-CM | POA: Diagnosis not present

## 2021-08-27 DIAGNOSIS — D225 Melanocytic nevi of trunk: Secondary | ICD-10-CM | POA: Diagnosis not present

## 2021-08-27 DIAGNOSIS — L280 Lichen simplex chronicus: Secondary | ICD-10-CM | POA: Diagnosis not present

## 2021-08-27 DIAGNOSIS — Z85820 Personal history of malignant melanoma of skin: Secondary | ICD-10-CM | POA: Diagnosis not present

## 2021-08-27 DIAGNOSIS — Z08 Encounter for follow-up examination after completed treatment for malignant neoplasm: Secondary | ICD-10-CM | POA: Diagnosis not present

## 2021-08-27 DIAGNOSIS — L578 Other skin changes due to chronic exposure to nonionizing radiation: Secondary | ICD-10-CM | POA: Diagnosis not present

## 2021-08-27 DIAGNOSIS — Z1283 Encounter for screening for malignant neoplasm of skin: Secondary | ICD-10-CM | POA: Diagnosis not present

## 2021-09-10 DIAGNOSIS — J3089 Other allergic rhinitis: Secondary | ICD-10-CM | POA: Diagnosis not present

## 2021-09-10 DIAGNOSIS — J3081 Allergic rhinitis due to animal (cat) (dog) hair and dander: Secondary | ICD-10-CM | POA: Diagnosis not present

## 2021-09-10 DIAGNOSIS — J301 Allergic rhinitis due to pollen: Secondary | ICD-10-CM | POA: Diagnosis not present

## 2021-09-23 DIAGNOSIS — J3081 Allergic rhinitis due to animal (cat) (dog) hair and dander: Secondary | ICD-10-CM | POA: Diagnosis not present

## 2021-09-23 DIAGNOSIS — J301 Allergic rhinitis due to pollen: Secondary | ICD-10-CM | POA: Diagnosis not present

## 2021-09-23 DIAGNOSIS — J3089 Other allergic rhinitis: Secondary | ICD-10-CM | POA: Diagnosis not present

## 2021-09-27 DIAGNOSIS — R202 Paresthesia of skin: Secondary | ICD-10-CM | POA: Diagnosis not present

## 2021-09-27 DIAGNOSIS — L280 Lichen simplex chronicus: Secondary | ICD-10-CM | POA: Diagnosis not present

## 2021-10-07 DIAGNOSIS — J3089 Other allergic rhinitis: Secondary | ICD-10-CM | POA: Diagnosis not present

## 2021-10-07 DIAGNOSIS — J3081 Allergic rhinitis due to animal (cat) (dog) hair and dander: Secondary | ICD-10-CM | POA: Diagnosis not present

## 2021-10-07 DIAGNOSIS — J301 Allergic rhinitis due to pollen: Secondary | ICD-10-CM | POA: Diagnosis not present

## 2021-10-21 DIAGNOSIS — J301 Allergic rhinitis due to pollen: Secondary | ICD-10-CM | POA: Diagnosis not present

## 2021-10-21 DIAGNOSIS — J3081 Allergic rhinitis due to animal (cat) (dog) hair and dander: Secondary | ICD-10-CM | POA: Diagnosis not present

## 2021-10-21 DIAGNOSIS — J3089 Other allergic rhinitis: Secondary | ICD-10-CM | POA: Diagnosis not present

## 2021-10-30 DIAGNOSIS — J3081 Allergic rhinitis due to animal (cat) (dog) hair and dander: Secondary | ICD-10-CM | POA: Diagnosis not present

## 2021-10-30 DIAGNOSIS — J3089 Other allergic rhinitis: Secondary | ICD-10-CM | POA: Diagnosis not present

## 2021-10-30 DIAGNOSIS — J301 Allergic rhinitis due to pollen: Secondary | ICD-10-CM | POA: Diagnosis not present

## 2021-11-04 DIAGNOSIS — J301 Allergic rhinitis due to pollen: Secondary | ICD-10-CM | POA: Diagnosis not present

## 2021-11-04 DIAGNOSIS — J3081 Allergic rhinitis due to animal (cat) (dog) hair and dander: Secondary | ICD-10-CM | POA: Diagnosis not present

## 2021-11-04 DIAGNOSIS — J3089 Other allergic rhinitis: Secondary | ICD-10-CM | POA: Diagnosis not present

## 2021-11-18 DIAGNOSIS — J301 Allergic rhinitis due to pollen: Secondary | ICD-10-CM | POA: Diagnosis not present

## 2021-11-18 DIAGNOSIS — J3089 Other allergic rhinitis: Secondary | ICD-10-CM | POA: Diagnosis not present

## 2021-11-18 DIAGNOSIS — J3081 Allergic rhinitis due to animal (cat) (dog) hair and dander: Secondary | ICD-10-CM | POA: Diagnosis not present

## 2021-12-02 DIAGNOSIS — J301 Allergic rhinitis due to pollen: Secondary | ICD-10-CM | POA: Diagnosis not present

## 2021-12-02 DIAGNOSIS — J3089 Other allergic rhinitis: Secondary | ICD-10-CM | POA: Diagnosis not present

## 2021-12-02 DIAGNOSIS — J3081 Allergic rhinitis due to animal (cat) (dog) hair and dander: Secondary | ICD-10-CM | POA: Diagnosis not present

## 2021-12-03 DIAGNOSIS — L821 Other seborrheic keratosis: Secondary | ICD-10-CM | POA: Diagnosis not present

## 2021-12-03 DIAGNOSIS — R202 Paresthesia of skin: Secondary | ICD-10-CM | POA: Diagnosis not present

## 2021-12-03 DIAGNOSIS — L905 Scar conditions and fibrosis of skin: Secondary | ICD-10-CM | POA: Diagnosis not present

## 2021-12-03 DIAGNOSIS — D3614 Benign neoplasm of peripheral nerves and autonomic nervous system of thorax: Secondary | ICD-10-CM | POA: Diagnosis not present

## 2022-02-24 DIAGNOSIS — J301 Allergic rhinitis due to pollen: Secondary | ICD-10-CM | POA: Diagnosis not present

## 2022-02-24 DIAGNOSIS — J3089 Other allergic rhinitis: Secondary | ICD-10-CM | POA: Diagnosis not present

## 2022-02-24 DIAGNOSIS — J3081 Allergic rhinitis due to animal (cat) (dog) hair and dander: Secondary | ICD-10-CM | POA: Diagnosis not present

## 2022-03-05 DIAGNOSIS — J3081 Allergic rhinitis due to animal (cat) (dog) hair and dander: Secondary | ICD-10-CM | POA: Diagnosis not present

## 2022-03-05 DIAGNOSIS — J3089 Other allergic rhinitis: Secondary | ICD-10-CM | POA: Diagnosis not present

## 2022-03-05 DIAGNOSIS — J301 Allergic rhinitis due to pollen: Secondary | ICD-10-CM | POA: Diagnosis not present

## 2022-03-10 DIAGNOSIS — D3614 Benign neoplasm of peripheral nerves and autonomic nervous system of thorax: Secondary | ICD-10-CM | POA: Diagnosis not present

## 2022-03-10 DIAGNOSIS — L578 Other skin changes due to chronic exposure to nonionizing radiation: Secondary | ICD-10-CM | POA: Diagnosis not present

## 2022-03-10 DIAGNOSIS — Z1283 Encounter for screening for malignant neoplasm of skin: Secondary | ICD-10-CM | POA: Diagnosis not present

## 2022-03-10 DIAGNOSIS — Z08 Encounter for follow-up examination after completed treatment for malignant neoplasm: Secondary | ICD-10-CM | POA: Diagnosis not present

## 2022-03-10 DIAGNOSIS — Z85820 Personal history of malignant melanoma of skin: Secondary | ICD-10-CM | POA: Diagnosis not present

## 2022-03-10 DIAGNOSIS — D225 Melanocytic nevi of trunk: Secondary | ICD-10-CM | POA: Diagnosis not present

## 2022-03-17 DIAGNOSIS — J3089 Other allergic rhinitis: Secondary | ICD-10-CM | POA: Diagnosis not present

## 2022-03-17 DIAGNOSIS — J301 Allergic rhinitis due to pollen: Secondary | ICD-10-CM | POA: Diagnosis not present

## 2022-03-17 DIAGNOSIS — J3081 Allergic rhinitis due to animal (cat) (dog) hair and dander: Secondary | ICD-10-CM | POA: Diagnosis not present

## 2022-03-20 DIAGNOSIS — J3081 Allergic rhinitis due to animal (cat) (dog) hair and dander: Secondary | ICD-10-CM | POA: Diagnosis not present

## 2022-03-20 DIAGNOSIS — J301 Allergic rhinitis due to pollen: Secondary | ICD-10-CM | POA: Diagnosis not present

## 2022-03-20 DIAGNOSIS — J3089 Other allergic rhinitis: Secondary | ICD-10-CM | POA: Diagnosis not present

## 2022-03-26 DIAGNOSIS — J3081 Allergic rhinitis due to animal (cat) (dog) hair and dander: Secondary | ICD-10-CM | POA: Diagnosis not present

## 2022-03-26 DIAGNOSIS — J3089 Other allergic rhinitis: Secondary | ICD-10-CM | POA: Diagnosis not present

## 2022-03-26 DIAGNOSIS — J301 Allergic rhinitis due to pollen: Secondary | ICD-10-CM | POA: Diagnosis not present

## 2022-03-28 DIAGNOSIS — J3081 Allergic rhinitis due to animal (cat) (dog) hair and dander: Secondary | ICD-10-CM | POA: Diagnosis not present

## 2022-03-28 DIAGNOSIS — J301 Allergic rhinitis due to pollen: Secondary | ICD-10-CM | POA: Diagnosis not present

## 2022-03-28 DIAGNOSIS — J3089 Other allergic rhinitis: Secondary | ICD-10-CM | POA: Diagnosis not present

## 2022-03-31 DIAGNOSIS — J301 Allergic rhinitis due to pollen: Secondary | ICD-10-CM | POA: Diagnosis not present

## 2022-03-31 DIAGNOSIS — J3089 Other allergic rhinitis: Secondary | ICD-10-CM | POA: Diagnosis not present

## 2022-03-31 DIAGNOSIS — J3081 Allergic rhinitis due to animal (cat) (dog) hair and dander: Secondary | ICD-10-CM | POA: Diagnosis not present

## 2022-04-03 DIAGNOSIS — J301 Allergic rhinitis due to pollen: Secondary | ICD-10-CM | POA: Diagnosis not present

## 2022-04-03 DIAGNOSIS — J3089 Other allergic rhinitis: Secondary | ICD-10-CM | POA: Diagnosis not present

## 2022-04-03 DIAGNOSIS — J3081 Allergic rhinitis due to animal (cat) (dog) hair and dander: Secondary | ICD-10-CM | POA: Diagnosis not present

## 2022-04-08 DIAGNOSIS — J3089 Other allergic rhinitis: Secondary | ICD-10-CM | POA: Diagnosis not present

## 2022-04-08 DIAGNOSIS — J3081 Allergic rhinitis due to animal (cat) (dog) hair and dander: Secondary | ICD-10-CM | POA: Diagnosis not present

## 2022-04-08 DIAGNOSIS — J301 Allergic rhinitis due to pollen: Secondary | ICD-10-CM | POA: Diagnosis not present

## 2022-04-10 DIAGNOSIS — J301 Allergic rhinitis due to pollen: Secondary | ICD-10-CM | POA: Diagnosis not present

## 2022-04-10 DIAGNOSIS — J3081 Allergic rhinitis due to animal (cat) (dog) hair and dander: Secondary | ICD-10-CM | POA: Diagnosis not present

## 2022-04-10 DIAGNOSIS — J3089 Other allergic rhinitis: Secondary | ICD-10-CM | POA: Diagnosis not present

## 2022-04-14 DIAGNOSIS — J3081 Allergic rhinitis due to animal (cat) (dog) hair and dander: Secondary | ICD-10-CM | POA: Diagnosis not present

## 2022-04-14 DIAGNOSIS — J3089 Other allergic rhinitis: Secondary | ICD-10-CM | POA: Diagnosis not present

## 2022-04-14 DIAGNOSIS — J301 Allergic rhinitis due to pollen: Secondary | ICD-10-CM | POA: Diagnosis not present

## 2022-04-16 ENCOUNTER — Ambulatory Visit (INDEPENDENT_AMBULATORY_CARE_PROVIDER_SITE_OTHER): Payer: Medicare Other | Admitting: Family Medicine

## 2022-04-16 ENCOUNTER — Encounter: Payer: Self-pay | Admitting: Family Medicine

## 2022-04-16 VITALS — BP 118/62 | HR 74 | Temp 98.9°F | Ht 62.0 in | Wt 138.2 lb

## 2022-04-16 DIAGNOSIS — Z23 Encounter for immunization: Secondary | ICD-10-CM | POA: Diagnosis not present

## 2022-04-16 DIAGNOSIS — G43109 Migraine with aura, not intractable, without status migrainosus: Secondary | ICD-10-CM

## 2022-04-16 DIAGNOSIS — Z98818 Other dental procedure status: Secondary | ICD-10-CM

## 2022-04-16 DIAGNOSIS — Z8709 Personal history of other diseases of the respiratory system: Secondary | ICD-10-CM | POA: Diagnosis not present

## 2022-04-16 DIAGNOSIS — Z7689 Persons encountering health services in other specified circumstances: Secondary | ICD-10-CM | POA: Diagnosis not present

## 2022-04-16 DIAGNOSIS — J3089 Other allergic rhinitis: Secondary | ICD-10-CM

## 2022-04-16 DIAGNOSIS — I10 Essential (primary) hypertension: Secondary | ICD-10-CM | POA: Diagnosis not present

## 2022-04-16 DIAGNOSIS — J301 Allergic rhinitis due to pollen: Secondary | ICD-10-CM | POA: Diagnosis not present

## 2022-04-16 DIAGNOSIS — J3081 Allergic rhinitis due to animal (cat) (dog) hair and dander: Secondary | ICD-10-CM | POA: Diagnosis not present

## 2022-04-16 LAB — VITAMIN D 25 HYDROXY (VIT D DEFICIENCY, FRACTURES): VITD: 30.46 ng/mL (ref 30.00–100.00)

## 2022-04-16 LAB — BASIC METABOLIC PANEL
BUN: 25 mg/dL — ABNORMAL HIGH (ref 6–23)
CO2: 28 mEq/L (ref 19–32)
Calcium: 10 mg/dL (ref 8.4–10.5)
Chloride: 100 mEq/L (ref 96–112)
Creatinine, Ser: 1 mg/dL (ref 0.40–1.20)
GFR: 54.78 mL/min — ABNORMAL LOW (ref >60.00)
Glucose, Bld: 79 mg/dL (ref 70–99)
Potassium: 4.2 mEq/L (ref 3.5–5.1)
Sodium: 138 mEq/L (ref 135–145)

## 2022-04-16 MED ORDER — OLMESARTAN MEDOXOMIL 20 MG PO TABS: 20.0000 mg | ORAL_TABLET | Freq: Every day | ORAL | 3 refills | Status: DC

## 2022-04-16 MED ORDER — HYDROCHLOROTHIAZIDE 25 MG PO TABS: 25.0000 mg | ORAL_TABLET | Freq: Every day | ORAL | 3 refills | Status: DC

## 2022-04-16 NOTE — Patient Instructions (Addendum)
It was a pleasure meeting you today.  I placed the order for your vitamin D as requested by your dentist.  Medicare may not pay for this so they require that you signed a notice stating you are aware of this.  I also put in an order for a BMP.  This is a lab that checks your potassium and renal function.  This is important given you are on blood pressure medication.

## 2022-04-16 NOTE — Progress Notes (Signed)
Patient presents to clinic today to follow-up on ongoing concerns and establish care.  SUBJECTIVE: PMH: Patient is a 76 year old female with PMH SIG for allergies, migraines, HTN, history of lung cancer.  Patient previously seen by Dr. Sherren Mocha [place].  Allergies: -Patient endorses allergies to dust, mold, cats, dogs, trees. -Followed by allergist, Dr. Tiajuana Amass -Receiving regular allergy shots -Took Flonase and Allegra when was unable to get allergy shots.  History of asthma: -Allergy shots have helped with asthma symptoms. -No longer requiring inhaler -No recent symptoms  Migraines: -Weather related.   -Regular type headaches triggered by allergies. -Last migraine 2-3 weeks ago. -Symptoms start with vision going out, and has "psychedelic vision". -Typically takes ibuprofen and lays down to resolve symptoms.  Generally wakes up feeling foggy.  Dermatitis: -[place] -Given betamethasone dipropionate cream 0.05% for a pruritic area on left lower back  Upcoming dental procedure: -Patient states she needs oral surgery. -Procedure not yet scheduled. -Oral surgeon requesting patient have vitamin D level checked and replacement started if below 40.  Hearing loss: -Wearing bilateral hearing aids -Patient's daughter has issues with hearing loss  HTN: -Taking HCTZ 25 mg daily and olmesartan medoxil 20 mg daily  History of lung cancer: Status post surgery 2016  Past surgical history: -Tubal ligation 1976 -D&C times 2, 1970-80s? 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  Patient is retired.  Patient has 2 adult children.  She was a Therapist, occupational  who completed some doctoral coursework.  Patient denies alcohol, tobacco, drug use.  Patient is a former smoker smoked off-and-on 50 years ago.  Trying to exercise regularly.  Health Maintenance: Dental --Dr. Lenard Simmer Vision --Dr. Clent Jacks Immunizations --Samul Dada about shingles, pneumonia vaccine, influenza vaccine, newest COVID booster Colonoscopy -- Mammogram --04-04-21 LMP--20 years ago. Bone Density --   Family medical history: Mom-deceased, hearing loss, mental illness Dad-deceased, CVA Sister, Reyne Dumas, alive, asthma Sister, Luther Bradley, 5, drug abuse Daughters-Michelle/Sandy, asthma, hearing loss, mental illness MGM-deceased, renal disease  Past Medical History:  Diagnosis Date   Arthritis    Asthma    last flare 6 mos to year ago   Blood in urine 3 years ago   Carcinoma, lung ([place])    Invasive adenocarcinoma status post LLL wedge resection with negative margin   Headache(784.0)    migraines none in last 10-15 years ago   Hypertension    PONV (postoperative nausea and vomiting)     Past Surgical History:  Procedure Laterality Date   COLONOSCOPY     DILATION AND CURETTAGE OF UTERUS     LYMPH NODE BIOPSY  2011   TONSILLECTOMY     TUBAL LIGATION     VIDEO ASSISTED THORACOSCOPY (VATS)/WEDGE RESECTION Left 04/25/2013   Procedure: VIDEO ASSISTED THORACOSCOPY, LEFT LOWER LOBE WEDGE RESECTION AND LYMPH NODE SAMPLING;  Surgeon: Melrose Nakayama, MD;  Location: [facility];  Service: Thoracic;  Laterality: Left;    Current Outpatient Medications on File Prior to Visit  Medication Sig Dispense Refill   escitalopram (LEXAPRO) 10 MG tablet Take 10 mg by mouth daily.     losartan-hydrochlorothiazide (HYZAAR) 100-25 MG tablet Take 1 tablet by mouth daily.     Multiple  Vitamins-Minerals (MULTIVITAMIN WITH MINERALS) tablet Take 1 tablet by mouth daily.     No current facility-administered medications on file prior to visit.    No Known Allergies  No family  history on file.  Social History   Socioeconomic History   Marital status: Married    Spouse name: Not on file   Number of children: Not on file   Years of education: Not on file   Highest education level: Not on file  Occupational History   Not on file  Tobacco Use   Smoking status: Never   Smokeless tobacco: Never  Substance and Sexual Activity   Alcohol use: No   Drug use: No   Sexual activity: Not on file  Other Topics Concern   Not on file  Social History Narrative   Not on file   Social Determinants of Health   Financial Resource Strain: Not on file  Food Insecurity: Not on file  Transportation Needs: Not on file  Physical Activity: Not on file  Stress: Not on file  Social Connections: Not on file  Intimate Partner Violence: Not on file    ROS General: Denies fever, chills, night sweats, changes in weight, changes in appetite HEENT: Denies headaches, ear pain, changes in vision, rhinorrhea, sore throat CV: Denies CP, palpitations, SOB, orthopnea Pulm: Denies SOB, cough, wheezing GI: Denies abdominal pain, nausea, vomiting, diarrhea, constipation GU: Denies dysuria, hematuria, frequency, vaginal discharge Msk: Denies muscle cramps, joint pains Neuro: Denies weakness, numbness, tingling Skin: Denies rashes, bruising Psych: Denies depression, anxiety, hallucinations  BP 118/62 (BP Location: Left Arm, Patient Position: Sitting, Cuff Size: Normal)   Pulse 74   Temp 98.9 F (37.2 C) (Oral)   Ht 5\' 2"  (1.575 m)   Wt 138 lb 3.2 oz (62.7 kg)   SpO2 96%   BMI 25.28 kg/m   Physical Exam Gen. Pleasant, well developed, well-nourished, in NAD HEENT - [place]/AT, PERRL, EOMI, conjunctive clear, no scleral icterus, no nasal drainage, pharynx without erythema or exudate.  TMs normal bilaterally. Neck: No JVD, no thyromegaly, no carotid bruits Lungs: no use of accessory muscles, CTAB, no wheezes, rales or rhonchi Cardiovascular: RRR,  No r/g/m, no peripheral  edema Musculoskeletal: No deformities, moves all four extremities, no cyanosis or clubbing, normal tone Neuro:  A&Ox3, CN II-XII intact, normal gait Skin:  Warm, dry, intact, no lesions  No results found for this or any previous visit (from the past 2160 hour(s)).  Assessment/Plan: Migraine with aura and without status migrainosus, not intractable -Stable -Discussed precautions -For continued or worsening symptoms consider starting abortive migraine medication such as Maxalt or Imitrex as well as following up with neurology.  Need for immunization against influenza -Also discussed other immunizations including shingles vaccine, pneumonia vaccine, new COVID booster.  Patient will look into these vaccinations at her local drugstore. - Plan: Flu Vaccine QUAD High Dose(Fluad)  Encounter to establish care -We reviewed the PMH, PSH, FH, SH, Meds and Allergies. -We provided refills for any medications we will prescribe as needed. -We addressed current concerns per orders and patient instructions. -We have asked for records for pertinent exams, studies, vaccines and notes from previous providers. -We have advised patient to follow up per instructions below.  Environmental and seasonal allergies -Continue follow-up with allergist for allergy shots -Continue Allegra and Flonase as needed  History of asthma -Stable -Not requiring medication at this time. -Continue to monitor  Essential hypertension -Controlled -Continue current medications including hydrochlorothiazide 25 mg daily and olmesartan 20 mg  daily -Continue lifestyle modifications -Continue to monitor BP at home - Plan: hydrochlorothiazide (HYDRODIURIL) 25 MG tablet, olmesartan (BENICAR) 20 MG tablet, Basic metabolic panel  Other dental procedure status -Vitamin D requested by patient's oral surgeon prior to scheduling dental procedure. - Plan: Vitamin D, 25-hydroxy  F/u as needed  Grier Mitts, MD

## 2022-04-21 DIAGNOSIS — J3089 Other allergic rhinitis: Secondary | ICD-10-CM | POA: Diagnosis not present

## 2022-04-21 DIAGNOSIS — J301 Allergic rhinitis due to pollen: Secondary | ICD-10-CM | POA: Diagnosis not present

## 2022-04-21 DIAGNOSIS — J3081 Allergic rhinitis due to animal (cat) (dog) hair and dander: Secondary | ICD-10-CM | POA: Diagnosis not present

## 2022-04-23 DIAGNOSIS — J3081 Allergic rhinitis due to animal (cat) (dog) hair and dander: Secondary | ICD-10-CM | POA: Diagnosis not present

## 2022-04-23 DIAGNOSIS — Z961 Presence of intraocular lens: Secondary | ICD-10-CM | POA: Diagnosis not present

## 2022-04-23 DIAGNOSIS — H1045 Other chronic allergic conjunctivitis: Secondary | ICD-10-CM | POA: Diagnosis not present

## 2022-04-23 DIAGNOSIS — J3089 Other allergic rhinitis: Secondary | ICD-10-CM | POA: Diagnosis not present

## 2022-04-23 DIAGNOSIS — J301 Allergic rhinitis due to pollen: Secondary | ICD-10-CM | POA: Diagnosis not present

## 2022-04-28 DIAGNOSIS — Z23 Encounter for immunization: Secondary | ICD-10-CM | POA: Diagnosis not present

## 2022-05-02 DIAGNOSIS — J3081 Allergic rhinitis due to animal (cat) (dog) hair and dander: Secondary | ICD-10-CM | POA: Diagnosis not present

## 2022-05-02 DIAGNOSIS — J301 Allergic rhinitis due to pollen: Secondary | ICD-10-CM | POA: Diagnosis not present

## 2022-05-02 DIAGNOSIS — J3089 Other allergic rhinitis: Secondary | ICD-10-CM | POA: Diagnosis not present

## 2022-05-05 DIAGNOSIS — J3081 Allergic rhinitis due to animal (cat) (dog) hair and dander: Secondary | ICD-10-CM | POA: Diagnosis not present

## 2022-05-05 DIAGNOSIS — J301 Allergic rhinitis due to pollen: Secondary | ICD-10-CM | POA: Diagnosis not present

## 2022-05-05 DIAGNOSIS — J3089 Other allergic rhinitis: Secondary | ICD-10-CM | POA: Diagnosis not present

## 2022-05-12 DIAGNOSIS — J301 Allergic rhinitis due to pollen: Secondary | ICD-10-CM | POA: Diagnosis not present

## 2022-05-12 DIAGNOSIS — J3081 Allergic rhinitis due to animal (cat) (dog) hair and dander: Secondary | ICD-10-CM | POA: Diagnosis not present

## 2022-05-12 DIAGNOSIS — J3089 Other allergic rhinitis: Secondary | ICD-10-CM | POA: Diagnosis not present

## 2022-05-15 DIAGNOSIS — J3081 Allergic rhinitis due to animal (cat) (dog) hair and dander: Secondary | ICD-10-CM | POA: Diagnosis not present

## 2022-05-15 DIAGNOSIS — J3089 Other allergic rhinitis: Secondary | ICD-10-CM | POA: Diagnosis not present

## 2022-05-15 DIAGNOSIS — J301 Allergic rhinitis due to pollen: Secondary | ICD-10-CM | POA: Diagnosis not present

## 2022-05-19 DIAGNOSIS — J3081 Allergic rhinitis due to animal (cat) (dog) hair and dander: Secondary | ICD-10-CM | POA: Diagnosis not present

## 2022-05-19 DIAGNOSIS — J301 Allergic rhinitis due to pollen: Secondary | ICD-10-CM | POA: Diagnosis not present

## 2022-05-19 DIAGNOSIS — J3089 Other allergic rhinitis: Secondary | ICD-10-CM | POA: Diagnosis not present

## 2022-05-26 DIAGNOSIS — J301 Allergic rhinitis due to pollen: Secondary | ICD-10-CM | POA: Diagnosis not present

## 2022-05-26 DIAGNOSIS — J3089 Other allergic rhinitis: Secondary | ICD-10-CM | POA: Diagnosis not present

## 2022-05-26 DIAGNOSIS — J3081 Allergic rhinitis due to animal (cat) (dog) hair and dander: Secondary | ICD-10-CM | POA: Diagnosis not present

## 2022-06-03 DIAGNOSIS — J3081 Allergic rhinitis due to animal (cat) (dog) hair and dander: Secondary | ICD-10-CM | POA: Diagnosis not present

## 2022-06-03 DIAGNOSIS — J301 Allergic rhinitis due to pollen: Secondary | ICD-10-CM | POA: Diagnosis not present

## 2022-06-03 DIAGNOSIS — J3089 Other allergic rhinitis: Secondary | ICD-10-CM | POA: Diagnosis not present

## 2022-06-09 DIAGNOSIS — J3081 Allergic rhinitis due to animal (cat) (dog) hair and dander: Secondary | ICD-10-CM | POA: Diagnosis not present

## 2022-06-09 DIAGNOSIS — J301 Allergic rhinitis due to pollen: Secondary | ICD-10-CM | POA: Diagnosis not present

## 2022-06-09 DIAGNOSIS — J3089 Other allergic rhinitis: Secondary | ICD-10-CM | POA: Diagnosis not present

## 2022-06-16 DIAGNOSIS — J301 Allergic rhinitis due to pollen: Secondary | ICD-10-CM | POA: Diagnosis not present

## 2022-06-16 DIAGNOSIS — J3081 Allergic rhinitis due to animal (cat) (dog) hair and dander: Secondary | ICD-10-CM | POA: Diagnosis not present

## 2022-06-16 DIAGNOSIS — J3089 Other allergic rhinitis: Secondary | ICD-10-CM | POA: Diagnosis not present

## 2022-06-23 DIAGNOSIS — J3081 Allergic rhinitis due to animal (cat) (dog) hair and dander: Secondary | ICD-10-CM | POA: Diagnosis not present

## 2022-06-23 DIAGNOSIS — J3089 Other allergic rhinitis: Secondary | ICD-10-CM | POA: Diagnosis not present

## 2022-06-23 DIAGNOSIS — J301 Allergic rhinitis due to pollen: Secondary | ICD-10-CM | POA: Diagnosis not present

## 2022-06-30 DIAGNOSIS — J301 Allergic rhinitis due to pollen: Secondary | ICD-10-CM | POA: Diagnosis not present

## 2022-06-30 DIAGNOSIS — J3081 Allergic rhinitis due to animal (cat) (dog) hair and dander: Secondary | ICD-10-CM | POA: Diagnosis not present

## 2022-06-30 DIAGNOSIS — J3089 Other allergic rhinitis: Secondary | ICD-10-CM | POA: Diagnosis not present

## 2022-07-04 ENCOUNTER — Ambulatory Visit: Payer: Medicare Other

## 2022-07-07 DIAGNOSIS — J3089 Other allergic rhinitis: Secondary | ICD-10-CM | POA: Diagnosis not present

## 2022-07-07 DIAGNOSIS — J3081 Allergic rhinitis due to animal (cat) (dog) hair and dander: Secondary | ICD-10-CM | POA: Diagnosis not present

## 2022-07-07 DIAGNOSIS — J301 Allergic rhinitis due to pollen: Secondary | ICD-10-CM | POA: Diagnosis not present

## 2022-07-09 ENCOUNTER — Ambulatory Visit (INDEPENDENT_AMBULATORY_CARE_PROVIDER_SITE_OTHER): Payer: Medicare Other

## 2022-07-09 VITALS — BP 120/62 | HR 97 | Temp 97.8°F | Ht 62.0 in | Wt 138.5 lb

## 2022-07-09 DIAGNOSIS — E2839 Other primary ovarian failure: Secondary | ICD-10-CM

## 2022-07-09 DIAGNOSIS — Z0000 Encounter for general adult medical examination without abnormal findings: Secondary | ICD-10-CM

## 2022-07-09 DIAGNOSIS — Z13820 Encounter for screening for osteoporosis: Secondary | ICD-10-CM | POA: Diagnosis not present

## 2022-07-09 NOTE — Progress Notes (Signed)
Subjective:   Doris Barton is a 76 y.o. female who presents for Medicare Annual (Subsequent) preventive examination.  Review of Systems     Cardiac Risk Factors include: advanced age (>42men, >3 women);hypertension     Objective:    Today's Vitals   07/09/22 1349  BP: 120/62  Pulse: 97  Temp: 97.8 F (36.6 C)  TempSrc: Oral  SpO2: 95%  Weight: 138 lb 8 oz (62.8 kg)  Height: 5\' 2"  (1.575 m)   Body mass index is 25.33 kg/m.     07/09/2022    2:05 PM 04/25/2013    3:00 PM 04/21/2013    1:39 PM  Advanced Directives  Does Patient Have a Medical Advance Directive? Yes Patient has advance directive, copy not in chart Patient has advance directive, copy not in chart  Type of Advance Directive [place];Living will [place];Living will [place];Living will  Copy of [place] in Chart? No - copy requested Copy requested from family Copy requested from family  Pre-existing out of facility DNR order (yellow form or pink MOST form)  No No    Current Medications (verified) Outpatient Encounter Medications as of 07/09/2022  Medication Sig   escitalopram (LEXAPRO) 10 MG tablet Take 10 mg by mouth daily.   hydrochlorothiazide (HYDRODIURIL) 25 MG tablet Take 1 tablet (25 mg total) by mouth daily.   losartan-hydrochlorothiazide (HYZAAR) 100-25 MG tablet Take 1 tablet by mouth daily.   Multiple Vitamins-Minerals (MULTIVITAMIN WITH MINERALS) tablet Take 1 tablet by mouth daily.   olmesartan (BENICAR) 20 MG tablet Take 1 tablet (20 mg total) by mouth daily.   No facility-administered encounter medications on file as of 07/09/2022.    Allergies (verified) Patient has no known allergies.   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 No pertinent family history. Social History   Socioeconomic History   Marital status: Widowed    Spouse name: Not on file   Number of children: Not on file   Years of education: Not on file   Highest education level: Not on file  Occupational History   Not on file  Tobacco Use   Smoking status: Never   Smokeless tobacco: Never  Substance and Sexual Activity   Alcohol use: No   Drug use: No   Sexual activity: Not on file  Other Topics Concern   Not on file  Social History Narrative   Not on file   Social Determinants of Health   Financial Resource Strain: Low Risk  (07/09/2022)   Overall Financial Resource Strain (CARDIA)  Difficulty of Paying Living Expenses: Not hard at all  Food Insecurity: No Food Insecurity (07/09/2022)   Hunger Vital Sign    Worried About Running Out of Food in the Last Year: Never true    Ran Out of Food in the Last Year: Never true  Transportation Needs: No Transportation Needs (07/09/2022)   PRAPARE - Hydrologist (Medical): No    Lack of Transportation (Non-Medical): No  Physical Activity: Inactive (07/09/2022)   Exercise Vital Sign    Days of Exercise per Week: 0 days    Minutes of Exercise per Session: 0 min  Stress: No Stress Concern Present (07/09/2022)   [place]    Feeling of Stress : Not at all  Social Connections: Moderately Integrated (07/09/2022)   Social Connection and Isolation Panel [NHANES]    Frequency of Communication with Friends and Family: More than three times a week    Frequency of Social Gatherings with Friends and Family: More than three times a week    Attends Religious Services: More than 4 times per year    Active Member of [employer] or Organizations: Yes    Attends Archivist Meetings: More than 4 times per year    Marital Status: Widowed    Tobacco Counseling Counseling given: Not Answered   Clinical Intake:  Pre-visit preparation completed: No  Pain : No/denies pain     BMI - recorded: 25.33 Nutritional Status: BMI 25 -29 Overweight Nutritional Risks: None Diabetes: No  How often do you need to have someone help you when you read instructions, pamphlets, or other written materials from your doctor or pharmacy?: 1 - Never  Diabetic?  No  Interpreter Needed?: No  Information entered by :: Rolene Arbour LPN   Activities of Daily Living    07/09/2022    2:04 PM  In your present state of health, do you have any difficulty performing the following activities:  Hearing? 1  Comment Wears hearing aids  Vision? 0  Difficulty concentrating or making decisions? 0  Walking or climbing stairs? 0  Dressing or bathing? 0  Doing errands, shopping? 0  Preparing Food and eating ? N  Using the Toilet? N  In the past six months, have you accidently leaked urine? N  Do you have problems with loss of bowel control? N  Managing your Medications? N  Managing your Finances? N  Housekeeping or managing your Housekeeping? N    Patient Care Team: Billie Ruddy, MD as PCP - General (Family Medicine)  Indicate any recent Medical Services you may have received from other than [facility] providers in the past year (date may be approximate).     Assessment:   This is a routine wellness examination for  Doris Barton.  Hearing/Vision screen Hearing Screening - Comments:: Wears hearing aids Vision Screening - Comments:: Wears rx glasses - up to date with routine eye exams with  Dr Katy Fitch  Dietary issues and exercise activities discussed: Exercise limited by: None identified   Goals Addressed               This Visit's Progress     Enjoy Life (pt-stated)         Depression Screen    07/09/2022    2:03 PM 07/09/2022    2:02 PM 04/16/2022    2:01 PM 08/03/2018    1:57 PM 07/18/2014    1:49 PM  PHQ 2/9 Scores  PHQ - 2 Score 0 0 0 0 0  PHQ- 9 Score   0      Fall Risk    07/09/2022    2:04 PM 04/16/2022    2:00 PM 08/03/2018    1:57 PM 02/23/2017    9:58 AM 07/18/2014    1:49 PM  Fall Risk   Falls in the past year? 0 0 0 No No  Comment    [facility] Telephone Survey: data to providers prior to load   Number falls in past yr: 0 0     Injury with Fall? 0 0     Risk for fall due to : No Fall Risks No Fall Risks     Follow up Falls prevention discussed Falls evaluation completed       [place]:  Any stairs in or around the home? Yes  If so, are there any without handrails? No  Home free of loose throw rugs in walkways, pet beds, electrical cords, etc? Yes  Adequate lighting in your home to reduce risk of falls? Yes   ASSISTIVE DEVICES UTILIZED TO PREVENT FALLS:  Life alert? No  Use of a cane, walker or w/c? No  Grab bars in the bathroom? Yes  Shower chair or bench in shower? Yes  Elevated toilet seat or a handicapped toilet? No   TIMED UP AND GO:  Was the test performed? Yes .  Length of time to ambulate 10 feet: 10 sec.   Gait steady and fast without use of assistive device  Cognitive Function:        07/09/2022    2:05 PM  6CIT Screen  What Year? 0 points  What month? 0 points  What time? 0 points  Count back from 20 0 points  Months in reverse 0 points  Repeat phrase 0 points  Total Score 0 points     Immunizations Immunization History  Administered Date(s) Administered   Fluad Quad(high Dose 65+) 04/16/2022   Influenza Split 04/05/2013, 05/03/2014   Influenza Whole 06/10/2007, 05/11/2008   Influenza, High Dose Seasonal PF 08/16/2014, 04/24/2015   Influenza,inj,Quad PF,6+ Mos 04/21/2018, 04/25/2019, 05/02/2020   Influenza-Unspecified 03/14/2016   Moderna Sars-Covid-2 Vaccination 08/23/2019, 09/20/2019, 03/24/2020, 11/20/2020, 04/13/2021   Pneumococcal Conjugate-13 12/13/2014   Pneumococcal Polysaccharide-23 04/28/2013, 04/01/2016   Td 08/04/1996, 11/04/2007   Zoster, Live 12/15/2007    TDAP status: Due, Education has been provided regarding the importance of this vaccine. Advised may receive this vaccine at local pharmacy or Health Dept. Aware to provide a copy of the vaccination record if obtained from local pharmacy or Health Dept. Verbalized acceptance and understanding.  Flu Vaccine status: Up to date  Pneumococcal vaccine status: Up to date  Covid-19 vaccine status: Completed vaccines  Qualifies for Shingles Vaccine? Yes   Zostavax completed No   Shingrix Completed?: No.    Education has been provided regarding the importance of this vaccine. Patient has been advised to call insurance company to determine out of pocket expense if they have not yet received this vaccine. Advised may also receive vaccine at local pharmacy or Health Dept. Verbalized acceptance and understanding.  Screening Tests Health Maintenance  Topic Date Due   DEXA SCAN  Never done   DTaP/Tdap/Td (3 - Tdap) 11/03/2017   COVID-19 Vaccine (6 - 2023-24 season) 07/25/2022 (Originally 04/04/2022)   Zoster Vaccines- Shingrix (1 of 2) 10/08/2022 (Originally 12/01/1964)   Hepatitis C Screening  07/10/2023 (Originally 12/02/1963)   Medicare Annual Wellness (  AWV)  07/10/2023   Pneumonia Vaccine 18+ Years old  Completed   INFLUENZA VACCINE  Completed   HPV VACCINES  Aged Out   COLONOSCOPY (Pts 45-37yrs  Insurance coverage will need to be confirmed)  Discontinued    Health Maintenance  Health Maintenance Due  Topic Date Due   DEXA SCAN  Never done   DTaP/Tdap/Td (3 - Tdap) 11/03/2017    Colorectal cancer screening: No longer required.   Mammogram status: No longer required due to Age.  Bone Density status: Ordered 07/09/22. Pt provided with contact info and advised to call to schedule appt.  Lung Cancer Screening: (Low Dose CT Chest recommended if Age 55-80 years, 30 pack-year currently smoking OR have quit w/in 15years.) does not qualify.     Additional Screening:  Hepatitis C Screening: does qualify; Completed Deferred  Vision Screening: Recommended annual ophthalmology exams for early detection of glaucoma and other disorders of the eye. Is the patient up to date with their annual eye exam?  Yes  Who is the provider or what is the name of the office in which the patient attends annual eye exams? Dr Katy Fitch If pt is not established with a provider, would they like to be referred to a provider to establish care? No .   Dental Screening: Recommended annual dental exams for proper oral hygiene  Community Resource Referral / Chronic Care Management:  CRR required this visit?  No   CCM required this visit?  No      Plan:     I have personally reviewed and noted the following in the patient's chart:   Medical and social history Use of alcohol, tobacco or illicit drugs  Current medications and supplements including opioid prescriptions. Patient is not currently taking opioid prescriptions. Functional ability and status Nutritional status Physical activity Advanced directives List of other physicians Hospitalizations, surgeries, and ER visits in previous 12 months Vitals Screenings to include cognitive, depression, and falls Referrals and appointments  In addition, I have reviewed and discussed with patient certain preventive protocols, quality metrics, and best  practice recommendations. A written personalized care plan for preventive services as well as general preventive health recommendations were provided to patient.     Criselda Peaches, LPN   [id number]/11/[id number]   Nurse Notes: Patient due Hepatitis C Screening and Dtap/Tdap/Td(3-Tdap)

## 2022-07-09 NOTE — Patient Instructions (Addendum)
Doris Barton , Thank you for taking time to come for your Medicare Wellness Visit. I appreciate your ongoing commitment to your health goals. Please review the following plan we discussed and let me know if I can assist you in the future.   These are the goals we discussed:  Goals       Enjoy Life (pt-stated)        This is a list of the screening recommended for you and due dates:  Health Maintenance  Topic Date Due   DEXA scan (bone density measurement)  Never done   DTaP/Tdap/Td vaccine (3 - Tdap) 11/03/2017   COVID-19 Vaccine (6 - 2023-24 season) 07/25/2022*   Zoster (Shingles) Vaccine (1 of 2) 10/08/2022*   Hepatitis C Screening: USPSTF Recommendation to screen - Ages 18-79 yo.  07/10/2023*   Medicare Annual Wellness Visit  07/10/2023   Pneumonia Vaccine  Completed   Flu Shot  Completed   HPV Vaccine  Aged Out   Colon Cancer Screening  Discontinued  *Topic was postponed. The date shown is not the original due date.    Advanced directives: Please bring a copy of your health care power of attorney and living will to the office to be added to your chart at your convenience.   Conditions/risks identified: None  Next appointment: Follow up in one year for your annual wellness visit    Preventive Care 65 Years and Older, Female Preventive care refers to lifestyle choices and visits with your health care provider that can promote health and wellness. What does preventive care include? A yearly physical exam. This is also called an annual well check. Dental exams once or twice a year. Routine eye exams. Ask your health care provider how often you should have your eyes checked. Personal lifestyle choices, including: Daily care of your teeth and gums. Regular physical activity. Eating a healthy diet. Avoiding tobacco and drug use. Limiting alcohol use. Practicing safe sex. Taking low-dose aspirin every day. Taking vitamin and mineral supplements as recommended by your health  care provider. What happens during an annual well check? The services and screenings done by your health care provider during your annual well check will depend on your age, overall health, lifestyle risk factors, and family history of disease. Counseling  Your health care provider may ask you questions about your: Alcohol use. Tobacco use. Drug use. Emotional well-being. Home and relationship well-being. Sexual activity. Eating habits. History of falls. Memory and ability to understand (cognition). Work and work Statistician. Reproductive health. Screening  You may have the following tests or measurements: Height, weight, and BMI. Blood pressure. Lipid and cholesterol levels. These may be checked every 5 years, or more frequently if you are over 16 years old. Skin check. Lung cancer screening. You may have this screening every year starting at age 56 if you have a 30-pack-year history of smoking and currently smoke or have quit within the past 15 years. Fecal occult blood test (FOBT) of the stool. You may have this test every year starting at age 62. Flexible sigmoidoscopy or colonoscopy. You may have a sigmoidoscopy every 5 years or a colonoscopy every 10 years starting at age 31. Hepatitis C blood test. Hepatitis B blood test. Sexually transmitted disease (STD) testing. Diabetes screening. This is done by checking your blood sugar (glucose) after you have not eaten for a while (fasting). You may have this done every 1-3 years. Bone density scan. This is done to screen for osteoporosis. You may have this  done starting at age 38. Mammogram. This may be done every 1-2 years. Talk to your health care provider about how often you should have regular mammograms. Talk with your health care provider about your test results, treatment options, and if necessary, the need for more tests. Vaccines  Your health care provider may recommend certain vaccines, such as: Influenza vaccine. This is  recommended every year. Tetanus, diphtheria, and acellular pertussis (Tdap, Td) vaccine. You may need a Td booster every 10 years. Zoster vaccine. You may need this after age 68. Pneumococcal 13-valent conjugate (PCV13) vaccine. One dose is recommended after age 32. Pneumococcal polysaccharide (PPSV23) vaccine. One dose is recommended after age 56. Talk to your health care provider about which screenings and vaccines you need and how often you need them. This information is not intended to replace advice given to you by your health care provider. Make sure you discuss any questions you have with your health care provider. Document Released: 08/17/2015 Document Revised: 04/09/2016 Document Reviewed: 05/22/2015 Elsevier Interactive Patient Education  2017 [place] Prevention in the Home Falls can cause injuries. They can happen to people of all ages. There are many things you can do to make your home safe and to help prevent falls. What can I do on the outside of my home? Regularly fix the edges of walkways and driveways and fix any cracks. Remove anything that might make you trip as you walk through a door, such as a raised step or threshold. Trim any bushes or trees on the path to your home. Use bright outdoor lighting. Clear any walking paths of anything that might make someone trip, such as rocks or tools. Regularly check to see if handrails are loose or broken. Make sure that both sides of any steps have handrails. Any raised decks and porches should have guardrails on the edges. Have any leaves, snow, or ice cleared regularly. Use sand or salt on walking paths during winter. Clean up any spills in your garage right away. This includes oil or grease spills. What can I do in the bathroom? Use night lights. Install grab bars by the toilet and in the tub and shower. Do not use towel bars as grab bars. Use non-skid mats or decals in the tub or shower. If you need to sit down in  the shower, use a plastic, non-slip stool. Keep the floor dry. Clean up any water that spills on the floor as soon as it happens. Remove soap buildup in the tub or shower regularly. Attach bath mats securely with double-sided non-slip rug tape. Do not have throw rugs and other things on the floor that can make you trip. What can I do in the bedroom? Use night lights. Make sure that you have a light by your bed that is easy to reach. Do not use any sheets or blankets that are too big for your bed. They should not hang down onto the floor. Have a firm chair that has side arms. You can use this for support while you get dressed. Do not have throw rugs and other things on the floor that can make you trip. What can I do in the kitchen? Clean up any spills right away. Avoid walking on wet floors. Keep items that you use a lot in easy-to-reach places. If you need to reach something above you, use a strong step stool that has a grab bar. Keep electrical cords out of the way. Do not use floor polish or wax  that makes floors slippery. If you must use wax, use non-skid floor wax. Do not have throw rugs and other things on the floor that can make you trip. What can I do with my stairs? Do not leave any items on the stairs. Make sure that there are handrails on both sides of the stairs and use them. Fix handrails that are broken or loose. Make sure that handrails are as long as the stairways. Check any carpeting to make sure that it is firmly attached to the stairs. Fix any carpet that is loose or worn. Avoid having throw rugs at the top or bottom of the stairs. If you do have throw rugs, attach them to the floor with carpet tape. Make sure that you have a light switch at the top of the stairs and the bottom of the stairs. If you do not have them, ask someone to add them for you. What else can I do to help prevent falls? Wear shoes that: Do not have high heels. Have rubber bottoms. Are comfortable  and fit you well. Are closed at the toe. Do not wear sandals. If you use a stepladder: Make sure that it is fully opened. Do not climb a closed stepladder. Make sure that both sides of the stepladder are locked into place. Ask someone to hold it for you, if possible. Clearly mark and make sure that you can see: Any grab bars or handrails. First and last steps. Where the edge of each step is. Use tools that help you move around (mobility aids) if they are needed. These include: Canes. Walkers. Scooters. Crutches. Turn on the lights when you go into a dark area. Replace any light bulbs as soon as they burn out. Set up your furniture so you have a clear path. Avoid moving your furniture around. If any of your floors are uneven, fix them. If there are any pets around you, be aware of where they are. Review your medicines with your doctor. Some medicines can make you feel dizzy. This can increase your chance of falling. Ask your doctor what other things that you can do to help prevent falls. This information is not intended to replace advice given to you by your health care provider. Make sure you discuss any questions you have with your health care provider. Document Released: 05/17/2009 Document Revised: 12/27/2015 Document Reviewed: 08/25/2014 Elsevier Interactive Patient Education  2017 [employer].

## 2022-07-17 DIAGNOSIS — J3081 Allergic rhinitis due to animal (cat) (dog) hair and dander: Secondary | ICD-10-CM | POA: Diagnosis not present

## 2022-07-17 DIAGNOSIS — J301 Allergic rhinitis due to pollen: Secondary | ICD-10-CM | POA: Diagnosis not present

## 2022-07-17 DIAGNOSIS — J3089 Other allergic rhinitis: Secondary | ICD-10-CM | POA: Diagnosis not present

## 2022-07-22 DIAGNOSIS — J3089 Other allergic rhinitis: Secondary | ICD-10-CM | POA: Diagnosis not present

## 2022-07-22 DIAGNOSIS — J3081 Allergic rhinitis due to animal (cat) (dog) hair and dander: Secondary | ICD-10-CM | POA: Diagnosis not present

## 2022-07-22 DIAGNOSIS — J301 Allergic rhinitis due to pollen: Secondary | ICD-10-CM | POA: Diagnosis not present

## 2022-07-24 DIAGNOSIS — J3089 Other allergic rhinitis: Secondary | ICD-10-CM | POA: Diagnosis not present

## 2022-07-24 DIAGNOSIS — J301 Allergic rhinitis due to pollen: Secondary | ICD-10-CM | POA: Diagnosis not present

## 2022-07-24 DIAGNOSIS — J3081 Allergic rhinitis due to animal (cat) (dog) hair and dander: Secondary | ICD-10-CM | POA: Diagnosis not present

## 2022-08-11 DIAGNOSIS — J301 Allergic rhinitis due to pollen: Secondary | ICD-10-CM | POA: Diagnosis not present

## 2022-08-11 DIAGNOSIS — J3081 Allergic rhinitis due to animal (cat) (dog) hair and dander: Secondary | ICD-10-CM | POA: Diagnosis not present

## 2022-08-11 DIAGNOSIS — J3089 Other allergic rhinitis: Secondary | ICD-10-CM | POA: Diagnosis not present

## 2022-08-18 DIAGNOSIS — J3089 Other allergic rhinitis: Secondary | ICD-10-CM | POA: Diagnosis not present

## 2022-08-18 DIAGNOSIS — J3081 Allergic rhinitis due to animal (cat) (dog) hair and dander: Secondary | ICD-10-CM | POA: Diagnosis not present

## 2022-08-18 DIAGNOSIS — J301 Allergic rhinitis due to pollen: Secondary | ICD-10-CM | POA: Diagnosis not present

## 2022-08-22 DIAGNOSIS — Z23 Encounter for immunization: Secondary | ICD-10-CM | POA: Diagnosis not present

## 2022-08-26 DIAGNOSIS — J301 Allergic rhinitis due to pollen: Secondary | ICD-10-CM | POA: Diagnosis not present

## 2022-08-26 DIAGNOSIS — J3081 Allergic rhinitis due to animal (cat) (dog) hair and dander: Secondary | ICD-10-CM | POA: Diagnosis not present

## 2022-08-26 DIAGNOSIS — J3089 Other allergic rhinitis: Secondary | ICD-10-CM | POA: Diagnosis not present

## 2022-09-03 DIAGNOSIS — J3089 Other allergic rhinitis: Secondary | ICD-10-CM | POA: Diagnosis not present

## 2022-09-03 DIAGNOSIS — J301 Allergic rhinitis due to pollen: Secondary | ICD-10-CM | POA: Diagnosis not present

## 2022-09-03 DIAGNOSIS — J3081 Allergic rhinitis due to animal (cat) (dog) hair and dander: Secondary | ICD-10-CM | POA: Diagnosis not present

## 2022-09-04 ENCOUNTER — Ambulatory Visit
Admission: RE | Admit: 2022-09-04 | Discharge: 2022-09-04 | Disposition: A | Discharge status: Home / Self Care | Payer: Medicare Other | Source: Ambulatory Visit | Attending: Family Medicine | Admitting: Family Medicine

## 2022-09-04 DIAGNOSIS — Z780 Asymptomatic menopausal state: Secondary | ICD-10-CM | POA: Diagnosis not present

## 2022-09-04 DIAGNOSIS — M85852 Other specified disorders of bone density and structure, left thigh: Secondary | ICD-10-CM | POA: Diagnosis not present

## 2022-09-04 DIAGNOSIS — M810 Age-related osteoporosis without current pathological fracture: Secondary | ICD-10-CM | POA: Diagnosis not present

## 2022-09-04 DIAGNOSIS — E2839 Other primary ovarian failure: Secondary | ICD-10-CM

## 2022-09-08 DIAGNOSIS — J3089 Other allergic rhinitis: Secondary | ICD-10-CM | POA: Diagnosis not present

## 2022-09-08 DIAGNOSIS — J3081 Allergic rhinitis due to animal (cat) (dog) hair and dander: Secondary | ICD-10-CM | POA: Diagnosis not present

## 2022-09-08 DIAGNOSIS — J301 Allergic rhinitis due to pollen: Secondary | ICD-10-CM | POA: Diagnosis not present

## 2022-09-10 ENCOUNTER — Telehealth: Payer: Self-pay | Admitting: Family Medicine

## 2022-09-10 NOTE — Telephone Encounter (Signed)
Pt called to say she reviewed her results on Mychart and has agreed to start taking the medication to treat  her Osteo.   Pt is asking that MD please send her Rx to:   [id number] - [place], [place] [address] Phone: [phone]  Fax: [phone]

## 2022-09-16 NOTE — Telephone Encounter (Signed)
Have pt schedule a virtual or f/u appt to discuss med/possible side effects.

## 2022-09-17 ENCOUNTER — Encounter: Payer: Self-pay | Admitting: Family Medicine

## 2022-09-17 ENCOUNTER — Ambulatory Visit (INDEPENDENT_AMBULATORY_CARE_PROVIDER_SITE_OTHER): Payer: Medicare Other | Admitting: Family Medicine

## 2022-09-17 VITALS — BP 120/60 | HR 65 | Temp 98.7°F | Ht 62.0 in | Wt 138.0 lb

## 2022-09-17 DIAGNOSIS — J069 Acute upper respiratory infection, unspecified: Secondary | ICD-10-CM

## 2022-09-17 DIAGNOSIS — M810 Age-related osteoporosis without current pathological fracture: Secondary | ICD-10-CM | POA: Diagnosis not present

## 2022-09-17 MED ORDER — ALENDRONATE SODIUM 10 MG PO TABS: 10.0000 mg | ORAL_TABLET | Freq: Every day | ORAL | 11 refills | Status: DC

## 2022-09-17 NOTE — Telephone Encounter (Signed)
Pt has been scheduled for a OV.

## 2022-09-17 NOTE — Progress Notes (Signed)
   Established Patient Office Visit   Subjective  Patient ID: Maanvi Lecompte, female    DOB: [birth year]  Age: 77 y.o. MRN: [id number]  Chief Complaint  Patient presents with   Consult    Pt is a 77 yo female seen for f/u and for acute concern.  Pt noted to have osteoporosis on recent bone density scan.  Here to review bisphosphonate therapy.  Patient also notes recent viral illness with cough, rhinorrhea, states the inside of her head feels raw.  Pt taking DayQuil and NyQuil for symptoms.  Notes some improvement today.      ROS Negative unless stated above    Objective:     BP 120/60   Pulse 65   Temp 98.7 F (37.1 C) (Oral)   Ht 5\' 2"  (1.575 m)   Wt 138 lb (62.6 kg)   SpO2 98%   BMI 25.24 kg/m    Physical Exam Constitutional:      General: She is not in acute distress.    Appearance: Normal appearance.  HENT:     Head: Normocephalic and atraumatic.     Comments: Hearing aids in place b/l.    Right Ear: Tympanic membrane normal.     Left Ear: Tympanic membrane normal.     Nose: Nose normal.     Mouth/Throat:     Mouth: Mucous membranes are moist.     Pharynx: No oropharyngeal exudate or posterior oropharyngeal erythema.  Eyes:     Extraocular Movements: Extraocular movements intact.     Conjunctiva/sclera: Conjunctivae normal.     Pupils: Pupils are equal, round, and reactive to light.  Cardiovascular:     Rate and Rhythm: Normal rate and regular rhythm.     Heart sounds: Normal heart sounds. No murmur heard.    No gallop.  Pulmonary:     Effort: Pulmonary effort is normal. No respiratory distress.     Breath sounds: Normal breath sounds. No wheezing, rhonchi or rales.  Skin:    General: Skin is warm and dry.  Neurological:     Mental Status: She is alert and oriented to person, place, and time.      No results found for any visits on 09/17/22.    Assessment & Plan:  Age-related osteoporosis without current pathological fracture -Bone  density scan 09/04/2022 with T-score -3.3 and left forearm. -Discussed optimizing calcium and vitamin D intake as well as starting weightbearing exercises -Reviewed bisphosphonate therapy.  Will start alendronate daily -     Alendronate Sodium; Take 1 tablet (10 mg total) by mouth daily before breakfast. Take with a full glass of water on an empty stomach.  Dispense: 30 tablet; Refill: 11  Viral URI -Improving -Continue supportive care with OTC cough/cold medications, rest, hydration, etc. -Follow-up for continued or worsening symptoms.  Return in about 4 weeks (around 10/15/2022), or if symptoms worsen or fail to improve.   Billie Ruddy, MD

## 2022-09-18 ENCOUNTER — Telehealth: Payer: Self-pay | Admitting: Family Medicine

## 2022-09-18 NOTE — Telephone Encounter (Signed)
Patient informed of the message below and verbalized understanding.  

## 2022-09-18 NOTE — Telephone Encounter (Signed)
Ok to wait until after procedure to start med.  Can take OTC vitamin D and calcium supplement, like oscal while waiting.

## 2022-09-18 NOTE — Telephone Encounter (Signed)
Patient says she is supposed to start (has not started yet)  alendronate (FOSAMAX) 10 MG tablet , the info packet she received says no major dental surgery while on this medication, has dental surgery scheduled for 10/14/22. Please advise as to whether she can have this surgery.

## 2022-10-06 DIAGNOSIS — J301 Allergic rhinitis due to pollen: Secondary | ICD-10-CM | POA: Diagnosis not present

## 2022-10-06 DIAGNOSIS — J3089 Other allergic rhinitis: Secondary | ICD-10-CM | POA: Diagnosis not present

## 2022-10-06 DIAGNOSIS — J3081 Allergic rhinitis due to animal (cat) (dog) hair and dander: Secondary | ICD-10-CM | POA: Diagnosis not present

## 2022-10-13 DIAGNOSIS — J3081 Allergic rhinitis due to animal (cat) (dog) hair and dander: Secondary | ICD-10-CM | POA: Diagnosis not present

## 2022-10-13 DIAGNOSIS — J3089 Other allergic rhinitis: Secondary | ICD-10-CM | POA: Diagnosis not present

## 2022-10-13 DIAGNOSIS — J301 Allergic rhinitis due to pollen: Secondary | ICD-10-CM | POA: Diagnosis not present

## 2022-10-15 ENCOUNTER — Encounter: Payer: Self-pay | Admitting: Family Medicine

## 2022-10-15 ENCOUNTER — Ambulatory Visit (INDEPENDENT_AMBULATORY_CARE_PROVIDER_SITE_OTHER): Payer: Medicare Other | Admitting: Family Medicine

## 2022-10-15 VITALS — BP 108/68 | HR 58 | Temp 97.8°F | Wt 139.8 lb

## 2022-10-15 DIAGNOSIS — M810 Age-related osteoporosis without current pathological fracture: Secondary | ICD-10-CM

## 2022-10-15 NOTE — Progress Notes (Signed)
   Established Patient Office Visit   Subjective  Patient ID: Doris Barton, female    DOB: [birth year]  Age: 77 y.o. MRN: [id number]  Chief Complaint  Patient presents with   Follow-up    77 year old female with pmh sig for cirrhosis, HTN for follow-up.  At last OFV patient given Rx Fosamax for osteoporosis noted on bone density scan 09/04/22.  Patient is yet to start medication as she is having a bone graft done of left upper maxilla with plans for dental implant.  Patient also needs to have 2 root canals.  Patient was advised that is okay to hold off on starting Fosamax.  Taking OTC vitamin D-calcium supplement and walking more for exercise.  Patient has upcoming appointment with dermatology.  Will update patient's immunizations, has COVID-vaccine cards with her.  Had [facility] booster on 04/28/2022 and 04/13/2021.  Had Moderna booster on 08/23/2019, 09/10/2019, 03/24/2020, and 11/10/2020.   ROS Negative unless stated above    Objective:     BP 108/68 (BP Location: Left Arm, Patient Position: Sitting, Cuff Size: Normal)   Pulse (!) 58   Temp 97.8 F (36.6 C) (Oral)   Wt 139 lb 12.8 oz (63.4 kg)   SpO2 97%   BMI 25.57 kg/m    Physical Exam Constitutional:      General: She is not in acute distress.    Appearance: Normal appearance.  HENT:     Head: Normocephalic and atraumatic.     Nose: Nose normal.     Mouth/Throat:     Mouth: Mucous membranes are moist.  Eyes:     Extraocular Movements: Extraocular movements intact.     Conjunctiva/sclera: Conjunctivae normal.     Pupils: Pupils are equal, round, and reactive to light.  Cardiovascular:     Rate and Rhythm: Normal rate.  Pulmonary:     Effort: Pulmonary effort is normal.     Breath sounds: Normal breath sounds.  Skin:    General: Skin is warm and dry.  Neurological:     Mental Status: She is alert and oriented to person, place, and time.      No results found for any visits on 10/15/22.     Assessment & Plan:  Age-related osteoporosis without current pathological fracture -Bone density scan 09/04/2022.  T-score -3.3 in left forearm -Continue OTC vitamin D and calcium supplement as well as weightbearing exercises until at least 1 mo after dental procedures. -repeat done density in 2 yrs  Return for As needed in the next 3-4 months.   Billie Ruddy, MD

## 2022-10-20 DIAGNOSIS — J3081 Allergic rhinitis due to animal (cat) (dog) hair and dander: Secondary | ICD-10-CM | POA: Diagnosis not present

## 2022-10-20 DIAGNOSIS — J301 Allergic rhinitis due to pollen: Secondary | ICD-10-CM | POA: Diagnosis not present

## 2022-10-20 DIAGNOSIS — J3089 Other allergic rhinitis: Secondary | ICD-10-CM | POA: Diagnosis not present

## 2022-10-23 DIAGNOSIS — L659 Nonscarring hair loss, unspecified: Secondary | ICD-10-CM | POA: Diagnosis not present

## 2022-10-23 DIAGNOSIS — L280 Lichen simplex chronicus: Secondary | ICD-10-CM | POA: Diagnosis not present

## 2022-10-23 DIAGNOSIS — L578 Other skin changes due to chronic exposure to nonionizing radiation: Secondary | ICD-10-CM | POA: Diagnosis not present

## 2022-10-23 DIAGNOSIS — L84 Corns and callosities: Secondary | ICD-10-CM | POA: Diagnosis not present

## 2022-10-27 DIAGNOSIS — J3089 Other allergic rhinitis: Secondary | ICD-10-CM | POA: Diagnosis not present

## 2022-10-27 DIAGNOSIS — J301 Allergic rhinitis due to pollen: Secondary | ICD-10-CM | POA: Diagnosis not present

## 2022-10-27 DIAGNOSIS — J3081 Allergic rhinitis due to animal (cat) (dog) hair and dander: Secondary | ICD-10-CM | POA: Diagnosis not present

## 2022-11-03 DIAGNOSIS — J301 Allergic rhinitis due to pollen: Secondary | ICD-10-CM | POA: Diagnosis not present

## 2022-11-03 DIAGNOSIS — J3081 Allergic rhinitis due to animal (cat) (dog) hair and dander: Secondary | ICD-10-CM | POA: Diagnosis not present

## 2022-11-03 DIAGNOSIS — J3089 Other allergic rhinitis: Secondary | ICD-10-CM | POA: Diagnosis not present

## 2022-11-11 DIAGNOSIS — J3089 Other allergic rhinitis: Secondary | ICD-10-CM | POA: Diagnosis not present

## 2022-11-11 DIAGNOSIS — J301 Allergic rhinitis due to pollen: Secondary | ICD-10-CM | POA: Diagnosis not present

## 2022-11-11 DIAGNOSIS — J3081 Allergic rhinitis due to animal (cat) (dog) hair and dander: Secondary | ICD-10-CM | POA: Diagnosis not present

## 2022-11-17 DIAGNOSIS — J301 Allergic rhinitis due to pollen: Secondary | ICD-10-CM | POA: Diagnosis not present

## 2022-11-17 DIAGNOSIS — J3089 Other allergic rhinitis: Secondary | ICD-10-CM | POA: Diagnosis not present

## 2022-11-17 DIAGNOSIS — J3081 Allergic rhinitis due to animal (cat) (dog) hair and dander: Secondary | ICD-10-CM | POA: Diagnosis not present

## 2022-11-24 DIAGNOSIS — J301 Allergic rhinitis due to pollen: Secondary | ICD-10-CM | POA: Diagnosis not present

## 2022-11-24 DIAGNOSIS — J3081 Allergic rhinitis due to animal (cat) (dog) hair and dander: Secondary | ICD-10-CM | POA: Diagnosis not present

## 2022-11-24 DIAGNOSIS — J3089 Other allergic rhinitis: Secondary | ICD-10-CM | POA: Diagnosis not present

## 2022-12-01 DIAGNOSIS — J3081 Allergic rhinitis due to animal (cat) (dog) hair and dander: Secondary | ICD-10-CM | POA: Diagnosis not present

## 2022-12-01 DIAGNOSIS — J301 Allergic rhinitis due to pollen: Secondary | ICD-10-CM | POA: Diagnosis not present

## 2022-12-01 DIAGNOSIS — J3089 Other allergic rhinitis: Secondary | ICD-10-CM | POA: Diagnosis not present

## 2022-12-08 DIAGNOSIS — J3081 Allergic rhinitis due to animal (cat) (dog) hair and dander: Secondary | ICD-10-CM | POA: Diagnosis not present

## 2022-12-08 DIAGNOSIS — J3089 Other allergic rhinitis: Secondary | ICD-10-CM | POA: Diagnosis not present

## 2022-12-08 DIAGNOSIS — J301 Allergic rhinitis due to pollen: Secondary | ICD-10-CM | POA: Diagnosis not present

## 2022-12-15 DIAGNOSIS — J3089 Other allergic rhinitis: Secondary | ICD-10-CM | POA: Diagnosis not present

## 2022-12-15 DIAGNOSIS — J301 Allergic rhinitis due to pollen: Secondary | ICD-10-CM | POA: Diagnosis not present

## 2022-12-15 DIAGNOSIS — J3081 Allergic rhinitis due to animal (cat) (dog) hair and dander: Secondary | ICD-10-CM | POA: Diagnosis not present

## 2022-12-22 DIAGNOSIS — J3081 Allergic rhinitis due to animal (cat) (dog) hair and dander: Secondary | ICD-10-CM | POA: Diagnosis not present

## 2022-12-22 DIAGNOSIS — J3089 Other allergic rhinitis: Secondary | ICD-10-CM | POA: Diagnosis not present

## 2022-12-22 DIAGNOSIS — J301 Allergic rhinitis due to pollen: Secondary | ICD-10-CM | POA: Diagnosis not present

## 2022-12-30 DIAGNOSIS — J3081 Allergic rhinitis due to animal (cat) (dog) hair and dander: Secondary | ICD-10-CM | POA: Diagnosis not present

## 2022-12-30 DIAGNOSIS — J301 Allergic rhinitis due to pollen: Secondary | ICD-10-CM | POA: Diagnosis not present

## 2022-12-30 DIAGNOSIS — J3089 Other allergic rhinitis: Secondary | ICD-10-CM | POA: Diagnosis not present

## 2023-01-05 DIAGNOSIS — J3081 Allergic rhinitis due to animal (cat) (dog) hair and dander: Secondary | ICD-10-CM | POA: Diagnosis not present

## 2023-01-05 DIAGNOSIS — J3089 Other allergic rhinitis: Secondary | ICD-10-CM | POA: Diagnosis not present

## 2023-01-05 DIAGNOSIS — J301 Allergic rhinitis due to pollen: Secondary | ICD-10-CM | POA: Diagnosis not present

## 2023-01-12 DIAGNOSIS — J301 Allergic rhinitis due to pollen: Secondary | ICD-10-CM | POA: Diagnosis not present

## 2023-01-12 DIAGNOSIS — J3089 Other allergic rhinitis: Secondary | ICD-10-CM | POA: Diagnosis not present

## 2023-01-12 DIAGNOSIS — J3081 Allergic rhinitis due to animal (cat) (dog) hair and dander: Secondary | ICD-10-CM | POA: Diagnosis not present

## 2023-01-19 DIAGNOSIS — J3089 Other allergic rhinitis: Secondary | ICD-10-CM | POA: Diagnosis not present

## 2023-01-19 DIAGNOSIS — J301 Allergic rhinitis due to pollen: Secondary | ICD-10-CM | POA: Diagnosis not present

## 2023-01-19 DIAGNOSIS — J3081 Allergic rhinitis due to animal (cat) (dog) hair and dander: Secondary | ICD-10-CM | POA: Diagnosis not present

## 2023-01-26 DIAGNOSIS — J301 Allergic rhinitis due to pollen: Secondary | ICD-10-CM | POA: Diagnosis not present

## 2023-01-26 DIAGNOSIS — J3089 Other allergic rhinitis: Secondary | ICD-10-CM | POA: Diagnosis not present

## 2023-01-26 DIAGNOSIS — J3081 Allergic rhinitis due to animal (cat) (dog) hair and dander: Secondary | ICD-10-CM | POA: Diagnosis not present

## 2023-02-02 DIAGNOSIS — J3081 Allergic rhinitis due to animal (cat) (dog) hair and dander: Secondary | ICD-10-CM | POA: Diagnosis not present

## 2023-02-02 DIAGNOSIS — J3089 Other allergic rhinitis: Secondary | ICD-10-CM | POA: Diagnosis not present

## 2023-02-02 DIAGNOSIS — J301 Allergic rhinitis due to pollen: Secondary | ICD-10-CM | POA: Diagnosis not present

## 2023-02-10 DIAGNOSIS — J301 Allergic rhinitis due to pollen: Secondary | ICD-10-CM | POA: Diagnosis not present

## 2023-02-10 DIAGNOSIS — J3081 Allergic rhinitis due to animal (cat) (dog) hair and dander: Secondary | ICD-10-CM | POA: Diagnosis not present

## 2023-02-10 DIAGNOSIS — J3089 Other allergic rhinitis: Secondary | ICD-10-CM | POA: Diagnosis not present

## 2023-02-16 DIAGNOSIS — J301 Allergic rhinitis due to pollen: Secondary | ICD-10-CM | POA: Diagnosis not present

## 2023-02-16 DIAGNOSIS — J3081 Allergic rhinitis due to animal (cat) (dog) hair and dander: Secondary | ICD-10-CM | POA: Diagnosis not present

## 2023-02-16 DIAGNOSIS — J3089 Other allergic rhinitis: Secondary | ICD-10-CM | POA: Diagnosis not present

## 2023-02-23 DIAGNOSIS — J3081 Allergic rhinitis due to animal (cat) (dog) hair and dander: Secondary | ICD-10-CM | POA: Diagnosis not present

## 2023-02-23 DIAGNOSIS — J301 Allergic rhinitis due to pollen: Secondary | ICD-10-CM | POA: Diagnosis not present

## 2023-02-23 DIAGNOSIS — J3089 Other allergic rhinitis: Secondary | ICD-10-CM | POA: Diagnosis not present

## 2023-03-02 DIAGNOSIS — J301 Allergic rhinitis due to pollen: Secondary | ICD-10-CM | POA: Diagnosis not present

## 2023-03-02 DIAGNOSIS — J3089 Other allergic rhinitis: Secondary | ICD-10-CM | POA: Diagnosis not present

## 2023-03-02 DIAGNOSIS — J3081 Allergic rhinitis due to animal (cat) (dog) hair and dander: Secondary | ICD-10-CM | POA: Diagnosis not present

## 2023-03-09 DIAGNOSIS — J3081 Allergic rhinitis due to animal (cat) (dog) hair and dander: Secondary | ICD-10-CM | POA: Diagnosis not present

## 2023-03-09 DIAGNOSIS — J301 Allergic rhinitis due to pollen: Secondary | ICD-10-CM | POA: Diagnosis not present

## 2023-03-09 DIAGNOSIS — J3089 Other allergic rhinitis: Secondary | ICD-10-CM | POA: Diagnosis not present

## 2023-03-17 DIAGNOSIS — J301 Allergic rhinitis due to pollen: Secondary | ICD-10-CM | POA: Diagnosis not present

## 2023-03-17 DIAGNOSIS — J3089 Other allergic rhinitis: Secondary | ICD-10-CM | POA: Diagnosis not present

## 2023-03-17 DIAGNOSIS — J3081 Allergic rhinitis due to animal (cat) (dog) hair and dander: Secondary | ICD-10-CM | POA: Diagnosis not present

## 2023-03-18 DIAGNOSIS — J301 Allergic rhinitis due to pollen: Secondary | ICD-10-CM | POA: Diagnosis not present

## 2023-03-18 DIAGNOSIS — J3081 Allergic rhinitis due to animal (cat) (dog) hair and dander: Secondary | ICD-10-CM | POA: Diagnosis not present

## 2023-03-18 DIAGNOSIS — J3089 Other allergic rhinitis: Secondary | ICD-10-CM | POA: Diagnosis not present

## 2023-03-23 DIAGNOSIS — J301 Allergic rhinitis due to pollen: Secondary | ICD-10-CM | POA: Diagnosis not present

## 2023-03-23 DIAGNOSIS — J3081 Allergic rhinitis due to animal (cat) (dog) hair and dander: Secondary | ICD-10-CM | POA: Diagnosis not present

## 2023-03-23 DIAGNOSIS — J3089 Other allergic rhinitis: Secondary | ICD-10-CM | POA: Diagnosis not present

## 2023-03-30 DIAGNOSIS — J3089 Other allergic rhinitis: Secondary | ICD-10-CM | POA: Diagnosis not present

## 2023-03-30 DIAGNOSIS — J3081 Allergic rhinitis due to animal (cat) (dog) hair and dander: Secondary | ICD-10-CM | POA: Diagnosis not present

## 2023-03-30 DIAGNOSIS — J301 Allergic rhinitis due to pollen: Secondary | ICD-10-CM | POA: Diagnosis not present

## 2023-04-07 DIAGNOSIS — J3081 Allergic rhinitis due to animal (cat) (dog) hair and dander: Secondary | ICD-10-CM | POA: Diagnosis not present

## 2023-04-07 DIAGNOSIS — J301 Allergic rhinitis due to pollen: Secondary | ICD-10-CM | POA: Diagnosis not present

## 2023-04-07 DIAGNOSIS — J3089 Other allergic rhinitis: Secondary | ICD-10-CM | POA: Diagnosis not present

## 2023-04-13 DIAGNOSIS — J301 Allergic rhinitis due to pollen: Secondary | ICD-10-CM | POA: Diagnosis not present

## 2023-04-13 DIAGNOSIS — J3081 Allergic rhinitis due to animal (cat) (dog) hair and dander: Secondary | ICD-10-CM | POA: Diagnosis not present

## 2023-04-13 DIAGNOSIS — J3089 Other allergic rhinitis: Secondary | ICD-10-CM | POA: Diagnosis not present

## 2023-04-20 DIAGNOSIS — J301 Allergic rhinitis due to pollen: Secondary | ICD-10-CM | POA: Diagnosis not present

## 2023-04-20 DIAGNOSIS — J3089 Other allergic rhinitis: Secondary | ICD-10-CM | POA: Diagnosis not present

## 2023-04-20 DIAGNOSIS — J3081 Allergic rhinitis due to animal (cat) (dog) hair and dander: Secondary | ICD-10-CM | POA: Diagnosis not present

## 2023-04-27 DIAGNOSIS — J3089 Other allergic rhinitis: Secondary | ICD-10-CM | POA: Diagnosis not present

## 2023-04-27 DIAGNOSIS — J301 Allergic rhinitis due to pollen: Secondary | ICD-10-CM | POA: Diagnosis not present

## 2023-04-27 DIAGNOSIS — J3081 Allergic rhinitis due to animal (cat) (dog) hair and dander: Secondary | ICD-10-CM | POA: Diagnosis not present

## 2023-04-30 DIAGNOSIS — L905 Scar conditions and fibrosis of skin: Secondary | ICD-10-CM | POA: Diagnosis not present

## 2023-04-30 DIAGNOSIS — L578 Other skin changes due to chronic exposure to nonionizing radiation: Secondary | ICD-10-CM | POA: Diagnosis not present

## 2023-04-30 DIAGNOSIS — Z1283 Encounter for screening for malignant neoplasm of skin: Secondary | ICD-10-CM | POA: Diagnosis not present

## 2023-04-30 DIAGNOSIS — L821 Other seborrheic keratosis: Secondary | ICD-10-CM | POA: Diagnosis not present

## 2023-04-30 DIAGNOSIS — D3614 Benign neoplasm of peripheral nerves and autonomic nervous system of thorax: Secondary | ICD-10-CM | POA: Diagnosis not present

## 2023-04-30 DIAGNOSIS — Z85820 Personal history of malignant melanoma of skin: Secondary | ICD-10-CM | POA: Diagnosis not present

## 2023-04-30 DIAGNOSIS — D225 Melanocytic nevi of trunk: Secondary | ICD-10-CM | POA: Diagnosis not present

## 2023-04-30 DIAGNOSIS — Z08 Encounter for follow-up examination after completed treatment for malignant neoplasm: Secondary | ICD-10-CM | POA: Diagnosis not present

## 2023-05-06 DIAGNOSIS — J3081 Allergic rhinitis due to animal (cat) (dog) hair and dander: Secondary | ICD-10-CM | POA: Diagnosis not present

## 2023-05-06 DIAGNOSIS — J3089 Other allergic rhinitis: Secondary | ICD-10-CM | POA: Diagnosis not present

## 2023-05-06 DIAGNOSIS — J301 Allergic rhinitis due to pollen: Secondary | ICD-10-CM | POA: Diagnosis not present

## 2023-05-07 DIAGNOSIS — Z23 Encounter for immunization: Secondary | ICD-10-CM | POA: Diagnosis not present

## 2023-05-11 DIAGNOSIS — J3089 Other allergic rhinitis: Secondary | ICD-10-CM | POA: Diagnosis not present

## 2023-05-11 DIAGNOSIS — J301 Allergic rhinitis due to pollen: Secondary | ICD-10-CM | POA: Diagnosis not present

## 2023-05-11 DIAGNOSIS — J3081 Allergic rhinitis due to animal (cat) (dog) hair and dander: Secondary | ICD-10-CM | POA: Diagnosis not present

## 2023-05-18 DIAGNOSIS — J301 Allergic rhinitis due to pollen: Secondary | ICD-10-CM | POA: Diagnosis not present

## 2023-05-18 DIAGNOSIS — J3081 Allergic rhinitis due to animal (cat) (dog) hair and dander: Secondary | ICD-10-CM | POA: Diagnosis not present

## 2023-05-18 DIAGNOSIS — J3089 Other allergic rhinitis: Secondary | ICD-10-CM | POA: Diagnosis not present

## 2023-05-23 DIAGNOSIS — Z23 Encounter for immunization: Secondary | ICD-10-CM | POA: Diagnosis not present

## 2023-05-28 DIAGNOSIS — J301 Allergic rhinitis due to pollen: Secondary | ICD-10-CM | POA: Diagnosis not present

## 2023-05-28 DIAGNOSIS — J3089 Other allergic rhinitis: Secondary | ICD-10-CM | POA: Diagnosis not present

## 2023-05-28 DIAGNOSIS — J3081 Allergic rhinitis due to animal (cat) (dog) hair and dander: Secondary | ICD-10-CM | POA: Diagnosis not present

## 2023-06-02 DIAGNOSIS — J3089 Other allergic rhinitis: Secondary | ICD-10-CM | POA: Diagnosis not present

## 2023-06-02 DIAGNOSIS — J301 Allergic rhinitis due to pollen: Secondary | ICD-10-CM | POA: Diagnosis not present

## 2023-06-02 DIAGNOSIS — J3081 Allergic rhinitis due to animal (cat) (dog) hair and dander: Secondary | ICD-10-CM | POA: Diagnosis not present

## 2023-06-08 DIAGNOSIS — J3081 Allergic rhinitis due to animal (cat) (dog) hair and dander: Secondary | ICD-10-CM | POA: Diagnosis not present

## 2023-06-08 DIAGNOSIS — J3089 Other allergic rhinitis: Secondary | ICD-10-CM | POA: Diagnosis not present

## 2023-06-08 DIAGNOSIS — J301 Allergic rhinitis due to pollen: Secondary | ICD-10-CM | POA: Diagnosis not present

## 2023-06-15 DIAGNOSIS — J301 Allergic rhinitis due to pollen: Secondary | ICD-10-CM | POA: Diagnosis not present

## 2023-06-15 DIAGNOSIS — J3081 Allergic rhinitis due to animal (cat) (dog) hair and dander: Secondary | ICD-10-CM | POA: Diagnosis not present

## 2023-06-15 DIAGNOSIS — J3089 Other allergic rhinitis: Secondary | ICD-10-CM | POA: Diagnosis not present

## 2023-06-22 DIAGNOSIS — J301 Allergic rhinitis due to pollen: Secondary | ICD-10-CM | POA: Diagnosis not present

## 2023-06-22 DIAGNOSIS — J3089 Other allergic rhinitis: Secondary | ICD-10-CM | POA: Diagnosis not present

## 2023-06-22 DIAGNOSIS — J3081 Allergic rhinitis due to animal (cat) (dog) hair and dander: Secondary | ICD-10-CM | POA: Diagnosis not present

## 2023-06-24 DIAGNOSIS — H1045 Other chronic allergic conjunctivitis: Secondary | ICD-10-CM | POA: Diagnosis not present

## 2023-06-29 DIAGNOSIS — J301 Allergic rhinitis due to pollen: Secondary | ICD-10-CM | POA: Diagnosis not present

## 2023-06-29 DIAGNOSIS — J3081 Allergic rhinitis due to animal (cat) (dog) hair and dander: Secondary | ICD-10-CM | POA: Diagnosis not present

## 2023-06-29 DIAGNOSIS — J3089 Other allergic rhinitis: Secondary | ICD-10-CM | POA: Diagnosis not present

## 2023-06-30 ENCOUNTER — Other Ambulatory Visit: Payer: Self-pay | Admitting: Family Medicine

## 2023-06-30 DIAGNOSIS — I10 Essential (primary) hypertension: Secondary | ICD-10-CM

## 2023-07-02 ENCOUNTER — Other Ambulatory Visit: Payer: Self-pay | Admitting: Family Medicine

## 2023-07-02 DIAGNOSIS — I10 Essential (primary) hypertension: Secondary | ICD-10-CM

## 2023-07-06 DIAGNOSIS — J3089 Other allergic rhinitis: Secondary | ICD-10-CM | POA: Diagnosis not present

## 2023-07-06 DIAGNOSIS — J301 Allergic rhinitis due to pollen: Secondary | ICD-10-CM | POA: Diagnosis not present

## 2023-07-06 DIAGNOSIS — J3081 Allergic rhinitis due to animal (cat) (dog) hair and dander: Secondary | ICD-10-CM | POA: Diagnosis not present

## 2023-07-13 DIAGNOSIS — J3089 Other allergic rhinitis: Secondary | ICD-10-CM | POA: Diagnosis not present

## 2023-07-13 DIAGNOSIS — J301 Allergic rhinitis due to pollen: Secondary | ICD-10-CM | POA: Diagnosis not present

## 2023-07-13 DIAGNOSIS — J3081 Allergic rhinitis due to animal (cat) (dog) hair and dander: Secondary | ICD-10-CM | POA: Diagnosis not present

## 2023-07-17 ENCOUNTER — Ambulatory Visit: Payer: Medicare Other

## 2023-07-17 VITALS — BP 120/60 | Temp 98.0°F | Ht 62.0 in | Wt 135.4 lb

## 2023-07-17 DIAGNOSIS — Z0000 Encounter for general adult medical examination without abnormal findings: Secondary | ICD-10-CM

## 2023-07-17 NOTE — Progress Notes (Addendum)
Subjective:   Doris Barton is a 77 y.o. female who presents for Medicare Annual (Subsequent) preventive examination.   Visit Location: In Office  Patient Medicare AWV questionnaire was completed by the patient on 07/11/23; I have confirmed that all information answered by patient is correct and no changes since this date.  Cardiac Risk Factors include: advanced age (>55men, >71 women);hypertension     Objective:    Today's Vitals   07/17/23 1345  BP: 120/60  Temp: 98 F (36.7 C)  TempSrc: Oral  Weight: 135 lb 6.4 oz (61.4 kg)  Height: 5\' 2"  (1.575 m)   Body mass index is 24.76 kg/m.     07/17/2023    1:52 PM 07/09/2022    2:05 PM 04/25/2013    3:00 PM 04/21/2013    1:39 PM  Advanced Directives  Does Patient Have a Medical Advance Directive? Yes Yes Patient has advance directive, copy not in chart Patient has advance directive, copy not in chart  Type of Advance Directive Healthcare Power of [place];Living will Healthcare Power of [place];Living will Healthcare Power of [place];Living will Healthcare Power of [place];Living will  Copy of Healthcare Power of Attorney in Chart? No - copy requested No - copy requested Copy requested from family Copy requested from family  Pre-existing out of facility DNR order (yellow form or pink MOST form)   No No    Current Medications (verified) Outpatient Encounter Medications as of 07/17/2023  Medication Sig   alendronate (FOSAMAX) 10 MG tablet Take 1 tablet (10 mg total) by mouth daily before breakfast. Take with a full glass of water on an empty stomach.   hydrochlorothiazide (HYDRODIURIL) 25 MG tablet TAKE 1 TABLET (25 MG TOTAL) BY MOUTH DAILY.   Multiple Vitamins-Minerals (MULTIVITAMIN WITH MINERALS) tablet Take 1 tablet by mouth daily.   olmesartan (BENICAR) 20 MG tablet TAKE 1 TABLET BY MOUTH EVERY DAY   No facility-administered encounter medications on file as of 07/17/2023.    Allergies (verified) Patient has  no known allergies.   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 No pertinent family history. Social History   Socioeconomic History   Marital status: Widowed    Spouse name: Not on file   Number of children: Not on file   Years of education: Not on file   Highest education level: Not on file  Occupational History   Not on file  Tobacco Use   Smoking status: Never   Smokeless tobacco: Never  Substance and Sexual Activity   Alcohol use: No   Drug use: No   Sexual activity: Not on file  Other Topics Concern   Not on file  Social History Narrative   Not on file   Social Drivers of Health   Financial Resource Strain: Low Risk  (07/11/2023)   Overall Financial Resource Strain (CARDIA)    Difficulty of Paying Living Expenses: Not hard at all  Food Insecurity: No Food Insecurity (07/11/2023)   Hunger Vital Sign    Worried About Running Out of Food in the Last Year: Never true    Ran Out of Food in the Last Year: Never true  Transportation Needs: No Transportation Needs (07/11/2023)   PRAPARE - Administrator, Civil Service (Medical): No    Lack of Transportation (Non-Medical): No  Physical Activity: Inactive  (07/11/2023)   Exercise Vital Sign    Days of Exercise per Week: 0 days    Minutes of Exercise per Session: 0 min  Stress: No Stress Concern Present (07/11/2023)   [employer] of Occupational Health - Occupational Stress Questionnaire    Feeling of Stress : Not at all  Social Connections: Unknown (07/11/2023)   Social Connection and Isolation Panel [NHANES]    Frequency of Communication with Friends and Family: More than three times a week    Frequency of Social Gatherings with Friends and Family: Twice a week    Attends Religious Services: Not on Insurance claims handler of Clubs or Organizations: Yes    Attends Banker Meetings: More than 4 times per year    Marital Status: Widowed    Tobacco Counseling Counseling given: Not Answered   Clinical Intake:  Pre-visit preparation completed: Yes  Pain : No/denies pain     Nutritional Risks: None Diabetes: No  How often do you need to have someone help you when you read instructions, pamphlets, or other written materials from your doctor or pharmacy?: 1 - Never  Interpreter Needed?: No  Information entered by :: Theresa Mulligan LPN   Activities of Daily Living    07/11/2023   12:44 PM  In your present state of health, do you have any difficulty performing the following activities:  Hearing? 1  Comment Wears hearing aids  Vision? 0  Difficulty concentrating or making decisions? 0  Walking or climbing stairs? 0  Dressing or bathing? 0  Doing errands, shopping? 0  Preparing Food and eating ? N  Using the Toilet? N  In the past six months, have you accidently leaked urine? N  Do you have problems with loss of bowel control? N  Managing your Medications? N  Managing your Finances? N  Housekeeping or managing your Housekeeping? N    Patient Care Team: Deeann Saint, MD as PCP - General (Family Medicine)  Indicate any recent Medical Services you may have received from other than [facility] providers in the  past year (date may be approximate).     Assessment:   This is a routine wellness examination for [name].  Hearing/Vision screen Hearing Screening - Comments:: Wears hearing aids Vision Screening - Comments:: Wears rx glasses - up to date with routine eye exams with  Dr Dione Booze   Goals Addressed               This Visit's Progress     Stay Active (pt-stated)         Depression Screen    07/17/2023    2:04 PM 07/09/2022    2:03 PM 07/09/2022    2:02 PM 04/16/2022    2:01 PM 08/03/2018  1:57 PM 07/18/2014    1:49 PM  PHQ 2/9 Scores  PHQ - 2 Score 0 0 0 0 0 0  PHQ- 9 Score    0      Fall Risk    07/17/2023    1:51 PM 07/11/2023   12:44 PM 07/09/2022    2:04 PM 04/16/2022    2:00 PM 08/03/2018    1:57 PM  Fall Risk   Falls in the past year? 0 0 0 0 0  Number falls in past yr: 0 0 0 0   Injury with Fall? 0 0 0 0   Risk for fall due to : No Fall Risks  No Fall Risks No Fall Risks   Follow up Falls prevention discussed  Falls prevention discussed Falls evaluation completed     MEDICARE RISK AT HOME: Medicare Risk at Home Any stairs in or around the home?: (Patient-Rptd) (P) Yes If so, are there any without handrails?: (Patient-Rptd) (P) No Home free of loose throw rugs in walkways, pet beds, electrical cords, etc?: (Patient-Rptd) (P) Yes Adequate lighting in your home to reduce risk of falls?: (Patient-Rptd) (P) Yes Life alert?: (Patient-Rptd) (P) No Use of a cane, walker or w/c?: (Patient-Rptd) (P) No Grab bars in the bathroom?: (Patient-Rptd) (P) Yes Shower chair or bench in shower?: (Patient-Rptd) (P) Yes Elevated toilet seat or a handicapped toilet?: (Patient-Rptd) (P) No  TIMED UP AND GO:  Was the test performed?  Yes  Length of time to ambulate 10 feet: 10 sec Gait steady and fast without use of assistive device    Cognitive Function:        07/17/2023    1:53 PM 07/09/2022    2:05 PM  6CIT Screen  What Year? 0 points 0 points  What month? 0 points  0 points  What time? 0 points 0 points  Count back from 20 0 points 0 points  Months in reverse 0 points 0 points  Repeat phrase 0 points 0 points  Total Score 0 points 0 points    Immunizations Immunization History  Administered Date(s) Administered   Fluad Quad(high Dose 65+) 04/16/2022   Influenza Split 04/05/2013, 05/03/2014   Influenza Whole 06/10/2007, 05/11/2008   Influenza, High Dose Seasonal PF 08/16/2014, 04/24/2015   Influenza,inj,Quad PF,6+ Mos 04/21/2018, 04/25/2019, 05/02/2020   Influenza-Unspecified 03/14/2016   Moderna Sars-Covid-2 Vaccination 08/23/2019, 09/20/2019, 03/24/2020, 11/20/2020, 04/13/2021   PFIZER(Purple Top)SARS-COV-2 Vaccination 04/28/2022   Pneumococcal Conjugate-13 12/13/2014   Pneumococcal Polysaccharide-23 04/28/2013, 04/01/2016   Td 08/04/1996, 11/04/2007   Zoster Recombinant(Shingrix) 07/11/2022   Zoster, Live 12/15/2007    TDAP status: Due, Education has been provided regarding the importance of this vaccine. Advised may receive this vaccine at local pharmacy or Health Dept. Aware to provide a copy of the vaccination record if obtained from local pharmacy or Health Dept. Verbalized acceptance and understanding.  Flu Vaccine status: Declined, Education has been provided regarding the importance of this vaccine but patient still declined. Advised may receive this vaccine at local pharmacy or Health Dept. Aware to provide a copy of the vaccination record if obtained from local pharmacy or Health Dept. Verbalized acceptance and understanding.  Pneumococcal vaccine status: Up to date  Covid-19 vaccine status: Declined, Education has been provided regarding the importance of this vaccine but patient still declined. Advised may receive this vaccine at local pharmacy or Health Dept.or vaccine clinic. Aware to provide a copy of the vaccination record if obtained from local pharmacy or Health Dept. Verbalized acceptance and  understanding.  Qualifies for  Shingles Vaccine? Yes   Zostavax completed No   Shingrix Completed?: No.    Education has been provided regarding the importance of this vaccine. Patient has been advised to call insurance company to determine out of pocket expense if they have not yet received this vaccine. Advised may also receive vaccine at local pharmacy or Health Dept. Verbalized acceptance and understanding.  Screening Tests Health Maintenance  Topic Date Due   Hepatitis C Screening  Never done   DTaP/Tdap/Td (3 - Tdap) 11/03/2017   Zoster Vaccines- Shingrix (2 of 2) 09/05/2022   INFLUENZA VACCINE  03/05/2023   COVID-19 Vaccine (7 - 2024-25 season) 04/05/2023   Medicare Annual Wellness (AWV)  07/16/2024   Pneumonia Vaccine 7+ Years old  Completed   DEXA SCAN  Completed   HPV VACCINES  Aged Out   Colonoscopy  Discontinued    Health Maintenance  Health Maintenance Due  Topic Date Due   Hepatitis C Screening  Never done   DTaP/Tdap/Td (3 - Tdap) 11/03/2017   Zoster Vaccines- Shingrix (2 of 2) 09/05/2022   INFLUENZA VACCINE  03/05/2023   COVID-19 Vaccine (7 - 2024-25 season) 04/05/2023        Bone Density status: Completed 09/04/22. Results reflect: Bone density results: OSTEOPOROSIS. Repeat every   years.     Additional Screening:  Hepatitis C Screening: does qualify;  Deferred  Vision Screening: Recommended annual ophthalmology exams for early detection of glaucoma and other disorders of the eye. Is the patient up to date with their annual eye exam?  Yes  Who is the provider or what is the name of the office in which the patient attends annual eye exams? Dr Dione Booze If pt is not established with a provider, would they like to be referred to a provider to establish care? No .   Dental Screening: Recommended annual dental exams for proper oral hygiene    Community Resource Referral / Chronic Care Management:  CRR required this visit?  No   CCM required this visit?  No     Plan:     I have  personally reviewed and noted the following in the patient's chart:   Medical and social history Use of alcohol, tobacco or illicit drugs  Current medications and supplements including opioid prescriptions. Patient is not currently taking opioid prescriptions. Functional ability and status Nutritional status Physical activity Advanced directives List of other physicians Hospitalizations, surgeries, and ER visits in previous 12 months Vitals Screenings to include cognitive, depression, and falls Referrals and appointments  In addition, I have reviewed and discussed with patient certain preventive protocols, quality metrics, and best practice recommendations. A written personalized care plan for preventive services as well as general preventive health recommendations were provided to patient.     Tillie Rung, LPN   [id number]   After Visit Summary: (In Person-Printed) AVS printed and given to the patient  Nurse Notes: None

## 2023-07-17 NOTE — Patient Instructions (Addendum)
Doris Barton , Thank you for taking time to come for your Medicare Wellness Visit. I appreciate your ongoing commitment to your health goals. Please review the following plan we discussed and let me know if I can assist you in the future.   Referrals/Orders/Follow-Ups/Clinician Recommendations:   This is a list of the screening recommended for you and due dates:  Health Maintenance  Topic Date Due   Hepatitis C Screening  Never done   DTaP/Tdap/Td vaccine (3 - Tdap) 11/03/2017   Zoster (Shingles) Vaccine (2 of 2) 09/05/2022   Flu Shot  03/05/2023   COVID-19 Vaccine (7 - 2024-25 season) 04/05/2023   Medicare Annual Wellness Visit  07/16/2024   Pneumonia Vaccine  Completed   DEXA scan (bone density measurement)  Completed   HPV Vaccine  Aged Out   Colon Cancer Screening  Discontinued    Advanced directives: (In Chart) A copy of your advanced directives are scanned into your chart should your provider ever need it.  Next Medicare Annual Wellness Visit scheduled for next year: Yes

## 2023-07-20 DIAGNOSIS — J301 Allergic rhinitis due to pollen: Secondary | ICD-10-CM | POA: Diagnosis not present

## 2023-07-20 DIAGNOSIS — J3081 Allergic rhinitis due to animal (cat) (dog) hair and dander: Secondary | ICD-10-CM | POA: Diagnosis not present

## 2023-07-20 DIAGNOSIS — J3089 Other allergic rhinitis: Secondary | ICD-10-CM | POA: Diagnosis not present

## 2023-07-27 DIAGNOSIS — J3081 Allergic rhinitis due to animal (cat) (dog) hair and dander: Secondary | ICD-10-CM | POA: Diagnosis not present

## 2023-07-27 DIAGNOSIS — J301 Allergic rhinitis due to pollen: Secondary | ICD-10-CM | POA: Diagnosis not present

## 2023-07-27 DIAGNOSIS — J3089 Other allergic rhinitis: Secondary | ICD-10-CM | POA: Diagnosis not present

## 2023-08-03 DIAGNOSIS — J301 Allergic rhinitis due to pollen: Secondary | ICD-10-CM | POA: Diagnosis not present

## 2023-08-03 DIAGNOSIS — J3081 Allergic rhinitis due to animal (cat) (dog) hair and dander: Secondary | ICD-10-CM | POA: Diagnosis not present

## 2023-08-03 DIAGNOSIS — J3089 Other allergic rhinitis: Secondary | ICD-10-CM | POA: Diagnosis not present

## 2023-08-11 DIAGNOSIS — J3081 Allergic rhinitis due to animal (cat) (dog) hair and dander: Secondary | ICD-10-CM | POA: Diagnosis not present

## 2023-08-11 DIAGNOSIS — J301 Allergic rhinitis due to pollen: Secondary | ICD-10-CM | POA: Diagnosis not present

## 2023-08-17 DIAGNOSIS — J3089 Other allergic rhinitis: Secondary | ICD-10-CM | POA: Diagnosis not present

## 2023-08-17 DIAGNOSIS — J3081 Allergic rhinitis due to animal (cat) (dog) hair and dander: Secondary | ICD-10-CM | POA: Diagnosis not present

## 2023-08-17 DIAGNOSIS — J301 Allergic rhinitis due to pollen: Secondary | ICD-10-CM | POA: Diagnosis not present

## 2023-08-25 DIAGNOSIS — J3081 Allergic rhinitis due to animal (cat) (dog) hair and dander: Secondary | ICD-10-CM | POA: Diagnosis not present

## 2023-08-25 DIAGNOSIS — J301 Allergic rhinitis due to pollen: Secondary | ICD-10-CM | POA: Diagnosis not present

## 2023-08-25 DIAGNOSIS — J3089 Other allergic rhinitis: Secondary | ICD-10-CM | POA: Diagnosis not present

## 2023-08-27 DIAGNOSIS — J301 Allergic rhinitis due to pollen: Secondary | ICD-10-CM | POA: Diagnosis not present

## 2023-08-27 DIAGNOSIS — J3089 Other allergic rhinitis: Secondary | ICD-10-CM | POA: Diagnosis not present

## 2023-08-27 DIAGNOSIS — J3081 Allergic rhinitis due to animal (cat) (dog) hair and dander: Secondary | ICD-10-CM | POA: Diagnosis not present

## 2023-08-31 DIAGNOSIS — J3089 Other allergic rhinitis: Secondary | ICD-10-CM | POA: Diagnosis not present

## 2023-08-31 DIAGNOSIS — J3081 Allergic rhinitis due to animal (cat) (dog) hair and dander: Secondary | ICD-10-CM | POA: Diagnosis not present

## 2023-08-31 DIAGNOSIS — J301 Allergic rhinitis due to pollen: Secondary | ICD-10-CM | POA: Diagnosis not present

## 2023-09-07 DIAGNOSIS — J3081 Allergic rhinitis due to animal (cat) (dog) hair and dander: Secondary | ICD-10-CM | POA: Diagnosis not present

## 2023-09-07 DIAGNOSIS — J301 Allergic rhinitis due to pollen: Secondary | ICD-10-CM | POA: Diagnosis not present

## 2023-09-07 DIAGNOSIS — J3089 Other allergic rhinitis: Secondary | ICD-10-CM | POA: Diagnosis not present

## 2023-09-14 DIAGNOSIS — J301 Allergic rhinitis due to pollen: Secondary | ICD-10-CM | POA: Diagnosis not present

## 2023-09-14 DIAGNOSIS — J3089 Other allergic rhinitis: Secondary | ICD-10-CM | POA: Diagnosis not present

## 2023-09-14 DIAGNOSIS — J3081 Allergic rhinitis due to animal (cat) (dog) hair and dander: Secondary | ICD-10-CM | POA: Diagnosis not present

## 2023-09-18 ENCOUNTER — Other Ambulatory Visit: Payer: Self-pay | Admitting: Family Medicine

## 2023-09-18 DIAGNOSIS — M810 Age-related osteoporosis without current pathological fracture: Secondary | ICD-10-CM

## 2023-09-22 DIAGNOSIS — J3081 Allergic rhinitis due to animal (cat) (dog) hair and dander: Secondary | ICD-10-CM | POA: Diagnosis not present

## 2023-09-22 DIAGNOSIS — J3089 Other allergic rhinitis: Secondary | ICD-10-CM | POA: Diagnosis not present

## 2023-09-22 DIAGNOSIS — J301 Allergic rhinitis due to pollen: Secondary | ICD-10-CM | POA: Diagnosis not present

## 2023-09-28 DIAGNOSIS — J301 Allergic rhinitis due to pollen: Secondary | ICD-10-CM | POA: Diagnosis not present

## 2023-09-28 DIAGNOSIS — J3081 Allergic rhinitis due to animal (cat) (dog) hair and dander: Secondary | ICD-10-CM | POA: Diagnosis not present

## 2023-09-28 DIAGNOSIS — J3089 Other allergic rhinitis: Secondary | ICD-10-CM | POA: Diagnosis not present

## 2023-10-03 ENCOUNTER — Other Ambulatory Visit: Payer: Self-pay | Admitting: Family Medicine

## 2023-10-03 DIAGNOSIS — I10 Essential (primary) hypertension: Secondary | ICD-10-CM

## 2023-10-06 ENCOUNTER — Other Ambulatory Visit: Payer: Self-pay | Admitting: Family Medicine

## 2023-10-06 DIAGNOSIS — M810 Age-related osteoporosis without current pathological fracture: Secondary | ICD-10-CM

## 2023-10-13 DIAGNOSIS — J3089 Other allergic rhinitis: Secondary | ICD-10-CM | POA: Diagnosis not present

## 2023-10-13 DIAGNOSIS — J301 Allergic rhinitis due to pollen: Secondary | ICD-10-CM | POA: Diagnosis not present

## 2023-10-13 DIAGNOSIS — J3081 Allergic rhinitis due to animal (cat) (dog) hair and dander: Secondary | ICD-10-CM | POA: Diagnosis not present

## 2023-10-19 DIAGNOSIS — J3089 Other allergic rhinitis: Secondary | ICD-10-CM | POA: Diagnosis not present

## 2023-10-19 DIAGNOSIS — J3081 Allergic rhinitis due to animal (cat) (dog) hair and dander: Secondary | ICD-10-CM | POA: Diagnosis not present

## 2023-10-19 DIAGNOSIS — J301 Allergic rhinitis due to pollen: Secondary | ICD-10-CM | POA: Diagnosis not present

## 2023-10-26 DIAGNOSIS — J301 Allergic rhinitis due to pollen: Secondary | ICD-10-CM | POA: Diagnosis not present

## 2023-10-26 DIAGNOSIS — J3081 Allergic rhinitis due to animal (cat) (dog) hair and dander: Secondary | ICD-10-CM | POA: Diagnosis not present

## 2023-10-26 DIAGNOSIS — J3089 Other allergic rhinitis: Secondary | ICD-10-CM | POA: Diagnosis not present

## 2023-10-30 DIAGNOSIS — J301 Allergic rhinitis due to pollen: Secondary | ICD-10-CM | POA: Diagnosis not present

## 2023-10-30 DIAGNOSIS — J3089 Other allergic rhinitis: Secondary | ICD-10-CM | POA: Diagnosis not present

## 2023-10-30 DIAGNOSIS — J3081 Allergic rhinitis due to animal (cat) (dog) hair and dander: Secondary | ICD-10-CM | POA: Diagnosis not present

## 2023-11-02 DIAGNOSIS — J3089 Other allergic rhinitis: Secondary | ICD-10-CM | POA: Diagnosis not present

## 2023-11-02 DIAGNOSIS — J301 Allergic rhinitis due to pollen: Secondary | ICD-10-CM | POA: Diagnosis not present

## 2023-11-02 DIAGNOSIS — J3081 Allergic rhinitis due to animal (cat) (dog) hair and dander: Secondary | ICD-10-CM | POA: Diagnosis not present

## 2023-11-09 DIAGNOSIS — J301 Allergic rhinitis due to pollen: Secondary | ICD-10-CM | POA: Diagnosis not present

## 2023-11-09 DIAGNOSIS — J3081 Allergic rhinitis due to animal (cat) (dog) hair and dander: Secondary | ICD-10-CM | POA: Diagnosis not present

## 2023-11-09 DIAGNOSIS — J3089 Other allergic rhinitis: Secondary | ICD-10-CM | POA: Diagnosis not present

## 2023-11-16 DIAGNOSIS — J301 Allergic rhinitis due to pollen: Secondary | ICD-10-CM | POA: Diagnosis not present

## 2023-11-16 DIAGNOSIS — J3081 Allergic rhinitis due to animal (cat) (dog) hair and dander: Secondary | ICD-10-CM | POA: Diagnosis not present

## 2023-11-16 DIAGNOSIS — J3089 Other allergic rhinitis: Secondary | ICD-10-CM | POA: Diagnosis not present

## 2023-11-23 DIAGNOSIS — J301 Allergic rhinitis due to pollen: Secondary | ICD-10-CM | POA: Diagnosis not present

## 2023-11-23 DIAGNOSIS — J3089 Other allergic rhinitis: Secondary | ICD-10-CM | POA: Diagnosis not present

## 2023-11-23 DIAGNOSIS — J3081 Allergic rhinitis due to animal (cat) (dog) hair and dander: Secondary | ICD-10-CM | POA: Diagnosis not present

## 2023-11-30 DIAGNOSIS — J301 Allergic rhinitis due to pollen: Secondary | ICD-10-CM | POA: Diagnosis not present

## 2023-11-30 DIAGNOSIS — J3081 Allergic rhinitis due to animal (cat) (dog) hair and dander: Secondary | ICD-10-CM | POA: Diagnosis not present

## 2023-11-30 DIAGNOSIS — J3089 Other allergic rhinitis: Secondary | ICD-10-CM | POA: Diagnosis not present

## 2023-12-06 DIAGNOSIS — Z23 Encounter for immunization: Secondary | ICD-10-CM | POA: Diagnosis not present

## 2023-12-11 DIAGNOSIS — J3089 Other allergic rhinitis: Secondary | ICD-10-CM | POA: Diagnosis not present

## 2023-12-11 DIAGNOSIS — J301 Allergic rhinitis due to pollen: Secondary | ICD-10-CM | POA: Diagnosis not present

## 2023-12-11 DIAGNOSIS — J3081 Allergic rhinitis due to animal (cat) (dog) hair and dander: Secondary | ICD-10-CM | POA: Diagnosis not present

## 2023-12-17 DIAGNOSIS — J301 Allergic rhinitis due to pollen: Secondary | ICD-10-CM | POA: Diagnosis not present

## 2023-12-17 DIAGNOSIS — J3081 Allergic rhinitis due to animal (cat) (dog) hair and dander: Secondary | ICD-10-CM | POA: Diagnosis not present

## 2023-12-17 DIAGNOSIS — J3089 Other allergic rhinitis: Secondary | ICD-10-CM | POA: Diagnosis not present

## 2023-12-22 DIAGNOSIS — J3089 Other allergic rhinitis: Secondary | ICD-10-CM | POA: Diagnosis not present

## 2023-12-22 DIAGNOSIS — J301 Allergic rhinitis due to pollen: Secondary | ICD-10-CM | POA: Diagnosis not present

## 2023-12-22 DIAGNOSIS — J3081 Allergic rhinitis due to animal (cat) (dog) hair and dander: Secondary | ICD-10-CM | POA: Diagnosis not present

## 2023-12-29 DIAGNOSIS — J3089 Other allergic rhinitis: Secondary | ICD-10-CM | POA: Diagnosis not present

## 2023-12-29 DIAGNOSIS — J301 Allergic rhinitis due to pollen: Secondary | ICD-10-CM | POA: Diagnosis not present

## 2023-12-29 DIAGNOSIS — J3081 Allergic rhinitis due to animal (cat) (dog) hair and dander: Secondary | ICD-10-CM | POA: Diagnosis not present

## 2024-01-05 DIAGNOSIS — J3081 Allergic rhinitis due to animal (cat) (dog) hair and dander: Secondary | ICD-10-CM | POA: Diagnosis not present

## 2024-01-05 DIAGNOSIS — J3089 Other allergic rhinitis: Secondary | ICD-10-CM | POA: Diagnosis not present

## 2024-01-05 DIAGNOSIS — J301 Allergic rhinitis due to pollen: Secondary | ICD-10-CM | POA: Diagnosis not present

## 2024-01-06 ENCOUNTER — Other Ambulatory Visit: Payer: Self-pay | Admitting: Family Medicine

## 2024-01-06 DIAGNOSIS — I10 Essential (primary) hypertension: Secondary | ICD-10-CM

## 2024-01-11 ENCOUNTER — Other Ambulatory Visit: Payer: Self-pay | Admitting: Family Medicine

## 2024-01-11 DIAGNOSIS — M810 Age-related osteoporosis without current pathological fracture: Secondary | ICD-10-CM

## 2024-01-11 DIAGNOSIS — J301 Allergic rhinitis due to pollen: Secondary | ICD-10-CM | POA: Diagnosis not present

## 2024-01-11 DIAGNOSIS — J3089 Other allergic rhinitis: Secondary | ICD-10-CM | POA: Diagnosis not present

## 2024-01-11 DIAGNOSIS — J3081 Allergic rhinitis due to animal (cat) (dog) hair and dander: Secondary | ICD-10-CM | POA: Diagnosis not present

## 2024-01-11 DIAGNOSIS — I10 Essential (primary) hypertension: Secondary | ICD-10-CM

## 2024-01-15 ENCOUNTER — Ambulatory Visit (INDEPENDENT_AMBULATORY_CARE_PROVIDER_SITE_OTHER): Admitting: Family Medicine

## 2024-01-15 ENCOUNTER — Encounter: Payer: Self-pay | Admitting: Family Medicine

## 2024-01-15 VITALS — BP 130/60 | HR 72 | Temp 98.1°F | Ht 62.0 in | Wt 136.0 lb

## 2024-01-15 DIAGNOSIS — I10 Essential (primary) hypertension: Secondary | ICD-10-CM

## 2024-01-15 DIAGNOSIS — Z1159 Encounter for screening for other viral diseases: Secondary | ICD-10-CM | POA: Diagnosis not present

## 2024-01-15 DIAGNOSIS — M810 Age-related osteoporosis without current pathological fracture: Secondary | ICD-10-CM | POA: Diagnosis not present

## 2024-01-15 MED ORDER — OLMESARTAN MEDOXOMIL 20 MG PO TABS: 20.0000 mg | ORAL_TABLET | Freq: Every day | ORAL | 3 refills | Status: AC

## 2024-01-15 MED ORDER — ALENDRONATE SODIUM 10 MG PO TABS: 10.0000 mg | ORAL_TABLET | Freq: Every day | ORAL | 2 refills | Status: AC

## 2024-01-15 MED ORDER — HYDROCHLOROTHIAZIDE 25 MG PO TABS: 25.0000 mg | ORAL_TABLET | Freq: Every day | ORAL | 3 refills | Status: AC

## 2024-01-15 NOTE — Progress Notes (Signed)
 Established Patient Office Visit   Subjective  Patient ID: Doris Barton, female    DOB: [birth year]  Age: 78 y.o. MRN: [id number]  Chief Complaint  Patient presents with   Medical Management of Chronic Issues    Patient came in today for a medication follow-up     Patient is a 78 yo female seen for follow-up and med refill.  Patient states she is doing well overall.  Noticing a slight decrease in energy but may be related to allergies.  Still being seen by allergist and receiving allergy shots.  Notes ears feeling clogged due to allergies as she is watching her daughter's dog.  Since last OFV patient started taking Fosamax .  Denies any issue with med.  No recent falls.  BP controlled on HCTZ 25 mg and olmesartan  20 mg daily.  Patient active but finds exercise boring.    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   Headache(784.0)    migraines none in last 10-15 years ago   Hypertension    PONV (postoperative nausea and vomiting)    Past Surgical History:  Procedure Laterality Date   COLONOSCOPY     DILATION AND CURETTAGE OF UTERUS     LYMPH NODE BIOPSY  2011   TONSILLECTOMY     TUBAL LIGATION     VIDEO ASSISTED THORACOSCOPY (VATS)/WEDGE RESECTION Left 04/25/2013   Procedure: VIDEO ASSISTED THORACOSCOPY, LEFT LOWER LOBE WEDGE RESECTION AND LYMPH NODE SAMPLING;  Surgeon: Zelphia Higashi, MD;  Location: [facility] OR;  Service: Thoracic;  Laterality: Left;   Social History   Tobacco Use   Smoking status: Never   Smokeless tobacco: Never  Substance  Use Topics   Alcohol  use: No   Drug use: No   History reviewed. No pertinent family history. No Known Allergies  ROS Negative unless stated above    Objective:     BP 130/60 (BP Location: Left Arm, Patient Position: Sitting, Cuff Size: Normal)   Pulse 72   Temp 98.1 F (36.7 C) (Oral)   Ht 5' 2 (1.575 m)   Wt 136 lb (61.7 kg)   SpO2 98%   BMI 24.87 kg/m  BP Readings from Last 3 Encounters:  01/15/24 130/60  07/17/23 120/60  10/15/22 108/68   Wt Readings from Last 3 Encounters:  01/15/24 136 lb (61.7 kg)  07/17/23 135 lb 6.4 oz (61.4 kg)  10/15/22 139 lb 12.8 oz (63.4 kg)      Physical Exam Constitutional:      General: She is not in acute distress.    Appearance: Normal appearance.  HENT:     Head: Normocephalic and atraumatic.     Right Ear: Tympanic membrane normal. Decreased hearing noted.     Left Ear: Tympanic membrane normal. Decreased hearing noted.     Ears:     Comments: Hearing aids in place.    Nose: Nose normal.     Mouth/Throat:     Mouth: Mucous membranes are moist.   Cardiovascular:     Rate  and Rhythm: Normal rate and regular rhythm.     Heart sounds: Normal heart sounds. No murmur heard.    No gallop.  Pulmonary:     Effort: Pulmonary effort is normal. No respiratory distress.     Breath sounds: Normal breath sounds. No wheezing, rhonchi or rales.   Skin:    General: Skin is warm and dry.   Neurological:     Mental Status: She is alert and oriented to person, place, and time.        07/17/2023    2:04 PM 07/09/2022    2:03 PM 07/09/2022    2:02 PM  Depression screen PHQ 2/9  Decreased Interest 0 0 0  Down, Depressed, Hopeless 0 0 0  PHQ - 2 Score 0 0 0       No data to display           No results found for any visits on 01/15/24.    Assessment & Plan:   Essential hypertension -     CBC with Differential/Platelet; Future -     Comprehensive metabolic panel with GFR; Future -     Lipid panel; Future -     TSH;  Future -     hydroCHLOROthiazide ; Take 1 tablet (25 mg total) by mouth daily.  Dispense: 90 tablet; Refill: 3 -     Olmesartan  Medoxomil; Take 1 tablet (20 mg total) by mouth daily.  Dispense: 90 tablet; Refill: 3  Age-related osteoporosis without current pathological fracture -     VITAMIN D  25 Hydroxy (Vit-D Deficiency, Fractures); Future -     Alendronate  Sodium; Take 1 tablet (10 mg total) by mouth daily before breakfast. Take with a full glass of water on an empty stomach.  Dispense: 90 tablet; Refill: 2  Need for hepatitis C screening test -     Hepatitis C antibody  BP well-controlled.  Continue HCTZ 25 mg daily and olmesartan  20 mg daily.  Continue lifestyle modifications.  Obtain labs this visit to monitor renal function, etc.  Continue Fosamax .  No follow-ups on file.  Follow-up in 6 months to 1 year and for yrly AWV.  Viola Greulich, MD

## 2024-01-16 LAB — CBC WITH DIFFERENTIAL/PLATELET
Absolute Lymphocytes: 1631 {cells}/uL (ref 850–3900)
Absolute Monocytes: 367 {cells}/uL (ref 200–950)
Basophils Absolute: 49 {cells}/uL (ref 0–200)
Basophils Relative: 0.9 %
Eosinophils Absolute: 97 {cells}/uL (ref 15–500)
Eosinophils Relative: 1.8 %
HCT: 41.7 % (ref 35.0–45.0)
Hemoglobin: 13.7 g/dL (ref 11.7–15.5)
MCH: 30.7 pg (ref 27.0–33.0)
MCHC: 32.9 g/dL (ref 32.0–36.0)
MCV: 93.5 fL (ref 80.0–100.0)
MPV: 11.1 fL (ref 7.5–12.5)
Monocytes Relative: 6.8 %
Neutro Abs: 3256 {cells}/uL (ref 1500–7800)
Neutrophils Relative %: 60.3 %
Platelets: 219 10*3/uL (ref 140–400)
RBC: 4.46 10*6/uL (ref 3.80–5.10)
RDW: 12.9 % (ref 11.0–15.0)
Total Lymphocyte: 30.2 %
WBC: 5.4 10*3/uL (ref 3.8–10.8)

## 2024-01-16 LAB — LIPID PANEL
Cholesterol: 205 mg/dL — ABNORMAL HIGH (ref <200)
HDL: 76 mg/dL (ref >=50)
LDL Cholesterol (Calc): 105 mg/dL — ABNORMAL HIGH
Non-HDL Cholesterol (Calc): 129 mg/dL (ref <130)
Total CHOL/HDL Ratio: 2.7 (calc) (ref <5.0)
Triglycerides: 143 mg/dL (ref <150)

## 2024-01-16 LAB — COMPREHENSIVE METABOLIC PANEL WITH GFR
AG Ratio: 2 (calc) (ref 1.0–2.5)
ALT: 14 U/L (ref 6–29)
AST: 21 U/L (ref 10–35)
Albumin: 4.4 g/dL (ref 3.6–5.1)
Alkaline phosphatase (APISO): 59 U/L (ref 37–153)
BUN: 16 mg/dL (ref 7–25)
CO2: 29 mmol/L (ref 20–32)
Calcium: 9.5 mg/dL (ref 8.6–10.4)
Chloride: 102 mmol/L (ref 98–110)
Creat: 0.85 mg/dL (ref 0.60–1.00)
Globulin: 2.2 g/dL (ref 1.9–3.7)
Glucose, Bld: 80 mg/dL (ref 65–99)
Potassium: 4.1 mmol/L (ref 3.5–5.3)
Sodium: 141 mmol/L (ref 135–146)
Total Bilirubin: 0.6 mg/dL (ref 0.2–1.2)
Total Protein: 6.6 g/dL (ref 6.1–8.1)
eGFR: 70 mL/min/{1.73_m2} (ref >=60)

## 2024-01-16 LAB — VITAMIN D 25 HYDROXY (VIT D DEFICIENCY, FRACTURES): Vit D, 25-Hydroxy: 53 ng/mL (ref 30–100)

## 2024-01-16 LAB — TSH: TSH: 2.07 m[IU]/L (ref 0.40–4.50)

## 2024-01-16 LAB — HEPATITIS C ANTIBODY: Hepatitis C Ab: NONREACTIVE

## 2024-01-19 DIAGNOSIS — J3081 Allergic rhinitis due to animal (cat) (dog) hair and dander: Secondary | ICD-10-CM | POA: Diagnosis not present

## 2024-01-19 DIAGNOSIS — J301 Allergic rhinitis due to pollen: Secondary | ICD-10-CM | POA: Diagnosis not present

## 2024-01-19 DIAGNOSIS — J3089 Other allergic rhinitis: Secondary | ICD-10-CM | POA: Diagnosis not present

## 2024-01-25 ENCOUNTER — Ambulatory Visit: Payer: Self-pay | Admitting: Family Medicine

## 2024-01-25 DIAGNOSIS — J3089 Other allergic rhinitis: Secondary | ICD-10-CM | POA: Diagnosis not present

## 2024-01-25 DIAGNOSIS — J301 Allergic rhinitis due to pollen: Secondary | ICD-10-CM | POA: Diagnosis not present

## 2024-01-25 DIAGNOSIS — J3081 Allergic rhinitis due to animal (cat) (dog) hair and dander: Secondary | ICD-10-CM | POA: Diagnosis not present

## 2024-02-01 DIAGNOSIS — J3089 Other allergic rhinitis: Secondary | ICD-10-CM | POA: Diagnosis not present

## 2024-02-01 DIAGNOSIS — J301 Allergic rhinitis due to pollen: Secondary | ICD-10-CM | POA: Diagnosis not present

## 2024-02-01 DIAGNOSIS — J3081 Allergic rhinitis due to animal (cat) (dog) hair and dander: Secondary | ICD-10-CM | POA: Diagnosis not present

## 2024-02-08 DIAGNOSIS — J301 Allergic rhinitis due to pollen: Secondary | ICD-10-CM | POA: Diagnosis not present

## 2024-02-08 DIAGNOSIS — J3089 Other allergic rhinitis: Secondary | ICD-10-CM | POA: Diagnosis not present

## 2024-02-08 DIAGNOSIS — J3081 Allergic rhinitis due to animal (cat) (dog) hair and dander: Secondary | ICD-10-CM | POA: Diagnosis not present

## 2024-02-15 DIAGNOSIS — J3089 Other allergic rhinitis: Secondary | ICD-10-CM | POA: Diagnosis not present

## 2024-02-15 DIAGNOSIS — J3081 Allergic rhinitis due to animal (cat) (dog) hair and dander: Secondary | ICD-10-CM | POA: Diagnosis not present

## 2024-02-15 DIAGNOSIS — J301 Allergic rhinitis due to pollen: Secondary | ICD-10-CM | POA: Diagnosis not present

## 2024-02-22 DIAGNOSIS — J301 Allergic rhinitis due to pollen: Secondary | ICD-10-CM | POA: Diagnosis not present

## 2024-02-22 DIAGNOSIS — J3089 Other allergic rhinitis: Secondary | ICD-10-CM | POA: Diagnosis not present

## 2024-02-22 DIAGNOSIS — J3081 Allergic rhinitis due to animal (cat) (dog) hair and dander: Secondary | ICD-10-CM | POA: Diagnosis not present

## 2024-03-01 DIAGNOSIS — J3081 Allergic rhinitis due to animal (cat) (dog) hair and dander: Secondary | ICD-10-CM | POA: Diagnosis not present

## 2024-03-01 DIAGNOSIS — J3089 Other allergic rhinitis: Secondary | ICD-10-CM | POA: Diagnosis not present

## 2024-03-01 DIAGNOSIS — J301 Allergic rhinitis due to pollen: Secondary | ICD-10-CM | POA: Diagnosis not present

## 2024-03-07 DIAGNOSIS — J301 Allergic rhinitis due to pollen: Secondary | ICD-10-CM | POA: Diagnosis not present

## 2024-03-07 DIAGNOSIS — J3089 Other allergic rhinitis: Secondary | ICD-10-CM | POA: Diagnosis not present

## 2024-03-07 DIAGNOSIS — H903 Sensorineural hearing loss, bilateral: Secondary | ICD-10-CM | POA: Diagnosis not present

## 2024-03-07 DIAGNOSIS — J3081 Allergic rhinitis due to animal (cat) (dog) hair and dander: Secondary | ICD-10-CM | POA: Diagnosis not present

## 2024-03-14 DIAGNOSIS — J3089 Other allergic rhinitis: Secondary | ICD-10-CM | POA: Diagnosis not present

## 2024-03-14 DIAGNOSIS — J3081 Allergic rhinitis due to animal (cat) (dog) hair and dander: Secondary | ICD-10-CM | POA: Diagnosis not present

## 2024-03-14 DIAGNOSIS — J301 Allergic rhinitis due to pollen: Secondary | ICD-10-CM | POA: Diagnosis not present

## 2024-03-21 DIAGNOSIS — J3089 Other allergic rhinitis: Secondary | ICD-10-CM | POA: Diagnosis not present

## 2024-03-21 DIAGNOSIS — J3081 Allergic rhinitis due to animal (cat) (dog) hair and dander: Secondary | ICD-10-CM | POA: Diagnosis not present

## 2024-03-21 DIAGNOSIS — J301 Allergic rhinitis due to pollen: Secondary | ICD-10-CM | POA: Diagnosis not present

## 2024-03-28 DIAGNOSIS — J3089 Other allergic rhinitis: Secondary | ICD-10-CM | POA: Diagnosis not present

## 2024-03-28 DIAGNOSIS — J301 Allergic rhinitis due to pollen: Secondary | ICD-10-CM | POA: Diagnosis not present

## 2024-03-28 DIAGNOSIS — J3081 Allergic rhinitis due to animal (cat) (dog) hair and dander: Secondary | ICD-10-CM | POA: Diagnosis not present

## 2024-03-31 DIAGNOSIS — J3089 Other allergic rhinitis: Secondary | ICD-10-CM | POA: Diagnosis not present

## 2024-03-31 DIAGNOSIS — J301 Allergic rhinitis due to pollen: Secondary | ICD-10-CM | POA: Diagnosis not present

## 2024-03-31 DIAGNOSIS — J3081 Allergic rhinitis due to animal (cat) (dog) hair and dander: Secondary | ICD-10-CM | POA: Diagnosis not present

## 2024-04-05 DIAGNOSIS — J301 Allergic rhinitis due to pollen: Secondary | ICD-10-CM | POA: Diagnosis not present

## 2024-04-05 DIAGNOSIS — J3089 Other allergic rhinitis: Secondary | ICD-10-CM | POA: Diagnosis not present

## 2024-04-05 DIAGNOSIS — J3081 Allergic rhinitis due to animal (cat) (dog) hair and dander: Secondary | ICD-10-CM | POA: Diagnosis not present

## 2024-04-11 DIAGNOSIS — J3089 Other allergic rhinitis: Secondary | ICD-10-CM | POA: Diagnosis not present

## 2024-04-11 DIAGNOSIS — J3081 Allergic rhinitis due to animal (cat) (dog) hair and dander: Secondary | ICD-10-CM | POA: Diagnosis not present

## 2024-04-11 DIAGNOSIS — J301 Allergic rhinitis due to pollen: Secondary | ICD-10-CM | POA: Diagnosis not present

## 2024-04-18 DIAGNOSIS — J3081 Allergic rhinitis due to animal (cat) (dog) hair and dander: Secondary | ICD-10-CM | POA: Diagnosis not present

## 2024-04-18 DIAGNOSIS — J301 Allergic rhinitis due to pollen: Secondary | ICD-10-CM | POA: Diagnosis not present

## 2024-04-18 DIAGNOSIS — J3089 Other allergic rhinitis: Secondary | ICD-10-CM | POA: Diagnosis not present

## 2024-04-20 DIAGNOSIS — Z23 Encounter for immunization: Secondary | ICD-10-CM | POA: Diagnosis not present

## 2024-04-22 DIAGNOSIS — Z23 Encounter for immunization: Secondary | ICD-10-CM | POA: Diagnosis not present

## 2024-04-27 DIAGNOSIS — J301 Allergic rhinitis due to pollen: Secondary | ICD-10-CM | POA: Diagnosis not present

## 2024-04-27 DIAGNOSIS — J3081 Allergic rhinitis due to animal (cat) (dog) hair and dander: Secondary | ICD-10-CM | POA: Diagnosis not present

## 2024-04-27 DIAGNOSIS — J3089 Other allergic rhinitis: Secondary | ICD-10-CM | POA: Diagnosis not present

## 2024-05-02 DIAGNOSIS — J301 Allergic rhinitis due to pollen: Secondary | ICD-10-CM | POA: Diagnosis not present

## 2024-05-02 DIAGNOSIS — J3081 Allergic rhinitis due to animal (cat) (dog) hair and dander: Secondary | ICD-10-CM | POA: Diagnosis not present

## 2024-05-02 DIAGNOSIS — J3089 Other allergic rhinitis: Secondary | ICD-10-CM | POA: Diagnosis not present

## 2024-05-05 DIAGNOSIS — L84 Corns and callosities: Secondary | ICD-10-CM | POA: Diagnosis not present

## 2024-05-05 DIAGNOSIS — L578 Other skin changes due to chronic exposure to nonionizing radiation: Secondary | ICD-10-CM | POA: Diagnosis not present

## 2024-05-05 DIAGNOSIS — L905 Scar conditions and fibrosis of skin: Secondary | ICD-10-CM | POA: Diagnosis not present

## 2024-05-05 DIAGNOSIS — Z1283 Encounter for screening for malignant neoplasm of skin: Secondary | ICD-10-CM | POA: Diagnosis not present

## 2024-05-05 DIAGNOSIS — Z08 Encounter for follow-up examination after completed treatment for malignant neoplasm: Secondary | ICD-10-CM | POA: Diagnosis not present

## 2024-05-05 DIAGNOSIS — D3614 Benign neoplasm of peripheral nerves and autonomic nervous system of thorax: Secondary | ICD-10-CM | POA: Diagnosis not present

## 2024-05-05 DIAGNOSIS — D225 Melanocytic nevi of trunk: Secondary | ICD-10-CM | POA: Diagnosis not present

## 2024-05-05 DIAGNOSIS — Z85820 Personal history of malignant melanoma of skin: Secondary | ICD-10-CM | POA: Diagnosis not present

## 2024-05-05 DIAGNOSIS — L570 Actinic keratosis: Secondary | ICD-10-CM | POA: Diagnosis not present

## 2024-05-05 DIAGNOSIS — L821 Other seborrheic keratosis: Secondary | ICD-10-CM | POA: Diagnosis not present

## 2024-05-10 DIAGNOSIS — J3089 Other allergic rhinitis: Secondary | ICD-10-CM | POA: Diagnosis not present

## 2024-05-10 DIAGNOSIS — J301 Allergic rhinitis due to pollen: Secondary | ICD-10-CM | POA: Diagnosis not present

## 2024-05-10 DIAGNOSIS — J3081 Allergic rhinitis due to animal (cat) (dog) hair and dander: Secondary | ICD-10-CM | POA: Diagnosis not present

## 2024-05-16 DIAGNOSIS — J3081 Allergic rhinitis due to animal (cat) (dog) hair and dander: Secondary | ICD-10-CM | POA: Diagnosis not present

## 2024-05-16 DIAGNOSIS — J301 Allergic rhinitis due to pollen: Secondary | ICD-10-CM | POA: Diagnosis not present

## 2024-05-16 DIAGNOSIS — J3089 Other allergic rhinitis: Secondary | ICD-10-CM | POA: Diagnosis not present

## 2024-05-23 DIAGNOSIS — J3081 Allergic rhinitis due to animal (cat) (dog) hair and dander: Secondary | ICD-10-CM | POA: Diagnosis not present

## 2024-05-23 DIAGNOSIS — J3089 Other allergic rhinitis: Secondary | ICD-10-CM | POA: Diagnosis not present

## 2024-05-23 DIAGNOSIS — J301 Allergic rhinitis due to pollen: Secondary | ICD-10-CM | POA: Diagnosis not present

## 2024-05-30 DIAGNOSIS — J3081 Allergic rhinitis due to animal (cat) (dog) hair and dander: Secondary | ICD-10-CM | POA: Diagnosis not present

## 2024-05-30 DIAGNOSIS — J3089 Other allergic rhinitis: Secondary | ICD-10-CM | POA: Diagnosis not present

## 2024-05-30 DIAGNOSIS — J301 Allergic rhinitis due to pollen: Secondary | ICD-10-CM | POA: Diagnosis not present

## 2024-06-06 DIAGNOSIS — J3089 Other allergic rhinitis: Secondary | ICD-10-CM | POA: Diagnosis not present

## 2024-06-06 DIAGNOSIS — J3081 Allergic rhinitis due to animal (cat) (dog) hair and dander: Secondary | ICD-10-CM | POA: Diagnosis not present

## 2024-06-06 DIAGNOSIS — J301 Allergic rhinitis due to pollen: Secondary | ICD-10-CM | POA: Diagnosis not present

## 2024-06-13 DIAGNOSIS — J301 Allergic rhinitis due to pollen: Secondary | ICD-10-CM | POA: Diagnosis not present

## 2024-06-13 DIAGNOSIS — J3089 Other allergic rhinitis: Secondary | ICD-10-CM | POA: Diagnosis not present

## 2024-06-13 DIAGNOSIS — J3081 Allergic rhinitis due to animal (cat) (dog) hair and dander: Secondary | ICD-10-CM | POA: Diagnosis not present

## 2024-06-20 DIAGNOSIS — J301 Allergic rhinitis due to pollen: Secondary | ICD-10-CM | POA: Diagnosis not present

## 2024-06-20 DIAGNOSIS — J3089 Other allergic rhinitis: Secondary | ICD-10-CM | POA: Diagnosis not present

## 2024-06-20 DIAGNOSIS — J3081 Allergic rhinitis due to animal (cat) (dog) hair and dander: Secondary | ICD-10-CM | POA: Diagnosis not present

## 2024-06-27 DIAGNOSIS — J301 Allergic rhinitis due to pollen: Secondary | ICD-10-CM | POA: Diagnosis not present

## 2024-06-27 DIAGNOSIS — J3081 Allergic rhinitis due to animal (cat) (dog) hair and dander: Secondary | ICD-10-CM | POA: Diagnosis not present

## 2024-06-27 DIAGNOSIS — J3089 Other allergic rhinitis: Secondary | ICD-10-CM | POA: Diagnosis not present

## 2024-07-04 DIAGNOSIS — J3089 Other allergic rhinitis: Secondary | ICD-10-CM | POA: Diagnosis not present

## 2024-07-04 DIAGNOSIS — J301 Allergic rhinitis due to pollen: Secondary | ICD-10-CM | POA: Diagnosis not present

## 2024-07-04 DIAGNOSIS — J3081 Allergic rhinitis due to animal (cat) (dog) hair and dander: Secondary | ICD-10-CM | POA: Diagnosis not present

## 2024-07-18 DIAGNOSIS — J3089 Other allergic rhinitis: Secondary | ICD-10-CM | POA: Diagnosis not present

## 2024-07-18 DIAGNOSIS — J3081 Allergic rhinitis due to animal (cat) (dog) hair and dander: Secondary | ICD-10-CM | POA: Diagnosis not present

## 2024-07-18 DIAGNOSIS — J301 Allergic rhinitis due to pollen: Secondary | ICD-10-CM | POA: Diagnosis not present

## 2024-07-27 ENCOUNTER — Ambulatory Visit: Payer: Medicare Other

## 2024-07-27 VITALS — BP 128/62 | HR 71 | Temp 97.7°F | Ht 62.0 in | Wt 134.4 lb

## 2024-07-27 DIAGNOSIS — Z0000 Encounter for general adult medical examination without abnormal findings: Secondary | ICD-10-CM

## 2024-07-27 NOTE — Progress Notes (Signed)
 "  Chief Complaint  Patient presents with   Medicare Wellness     Subjective:   Doris Barton is a 78 y.o. female who presents for a Medicare Annual Wellness Visit.  Visit info / Clinical Intake: Medicare Wellness Visit Type:: Subsequent Annual Wellness Visit Persons participating in visit and providing information:: patient Medicare Wellness Visit Mode:: In-person (required for WTM) Interpreter Needed?: No Pre-visit prep was completed: yes AWV questionnaire completed by patient prior to visit?: no Living arrangements:: (!) lives alone Patient's Overall Health Status Rating: excellent Typical amount of pain: none Does pain affect daily life?: no Are you currently prescribed opioids?: no  Dietary Habits and Nutritional Risks How many meals a day?: 3 Eats fruit and vegetables daily?: yes Most meals are obtained by: preparing own meals In the last 2 weeks, have you had any of the following?: none Diabetic:: no  Functional Status Activities of Daily Living (to include ambulation/medication): Independent Ambulation: Independent with device- listed below Home Assistive Devices/Equipment: Eyeglasses; Other (Comment) (Hearing Aids) Medication Administration: Independent Home Management (perform basic housework or laundry): Independent Manage your own finances?: yes Primary transportation is: driving Concerns about vision?: no *vision screening is required for WTM* Concerns about hearing?: (!) yes Uses hearing aids?: (!) yes Hear whispered voice?: yes  Fall Screening Falls in the past year?: 0 Number of falls in past year: 0 Was there an injury with Fall?: 0 Fall Risk Category Calculator: 0 Patient Fall Risk Level: Low Fall Risk  Fall Risk Patient at Risk for Falls Due to: No Fall Risks Fall risk Follow up: Falls evaluation completed  Home and Transportation Safety: All rugs have non-skid backing?: yes All stairs or steps have railings?: yes Grab bars in the  bathtub or shower?: (!) no Have non-skid surface in bathtub or shower?: yes Good home lighting?: yes Regular seat belt use?: yes Hospital stays in the last year:: no  Cognitive Assessment Difficulty concentrating, remembering, or making decisions? : no Will 6CIT or Mini Cog be Completed: yes What year is it?: 0 points What month is it?: 0 points Give patient an address phrase to remember (5 components): [address] [place]  About what time is it?: 0 points Count backwards from 20 to 1: 0 points Say the months of the year in reverse: 0 points Repeat the address phrase from earlier: 0 points 6 CIT Score: 0 points  Advance Directives (For Healthcare) Does Patient Have a Medical Advance Directive?: Yes Does patient want to make changes to medical advance directive?: No - Patient declined Type of Advance Directive: Healthcare Power of [place]; Living will Copy of Healthcare Power of Attorney in Chart?: Yes - validated most recent copy scanned in chart (See row information) Copy of Living Will in Chart?: Yes - validated most recent copy scanned in chart (See row information)  Reviewed/Updated  Reviewed/Updated: Reviewed All (Medical, Surgical, Family, Medications, Allergies, Care Teams, Patient Goals)    Allergies (verified) Patient has no known allergies.   Current Medications (verified) Outpatient Encounter Medications as of 07/27/2024  Medication Sig   alendronate  (FOSAMAX ) 10 MG tablet Take 1 tablet (10 mg total) by mouth daily before breakfast. Take with a full glass of water on an empty stomach.   hydrochlorothiazide  (HYDRODIURIL ) 25 MG tablet Take 1 tablet (25 mg total) by mouth daily.   Multiple Vitamins-Minerals (MULTIVITAMIN WITH MINERALS) tablet Take 1 tablet by mouth daily.   olmesartan  (BENICAR ) 20 MG tablet Take 1 tablet (20 mg total) by mouth daily.  No facility-administered encounter medications on file as of 07/27/2024.    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 No pertinent family history. Social History   Occupational History   Not on file  Tobacco Use   Smoking status: Never   Smokeless tobacco: Never  Substance and Sexual Activity   Alcohol  use: No   Drug use: No   Sexual activity: Not on file   Tobacco Counseling Counseling given: No  SDOH Screenings   Food Insecurity: No Food Insecurity (07/27/2024)  Housing: Unknown (07/27/2024)  Transportation Needs: No Transportation Needs (07/27/2024)  Utilities: Not At Risk (07/27/2024)  Alcohol  Screen: Low Risk (07/09/2022)  Depression (PHQ2-9): Low Risk (07/27/2024)  Financial Resource Strain: Low Risk (07/11/2023)  Physical Activity: Inactive (07/27/2024)  Social Connections: Moderately Integrated (07/27/2024)  Stress: No Stress Concern Present (07/27/2024)  Tobacco Use: Low Risk (07/27/2024)  Health Literacy: Adequate Health Literacy (07/27/2024)   See flowsheets for full screening details  Depression Screen PHQ 2 & 9 Depression Scale- Over the past 2 weeks, how often have you been bothered by any of the following problems? Little interest or pleasure in doing things: 0 Feeling down, depressed, or  hopeless (PHQ Adolescent also includes...irritable): 0 PHQ-2 Total Score: 0 Trouble falling or staying asleep, or sleeping too much: 0 Feeling tired or having little energy: 1 Poor appetite or overeating (PHQ Adolescent also includes...weight loss): 0 Feeling bad about yourself - or that you are a failure or have let yourself or your family down: 0 Trouble concentrating on things, such as reading the newspaper or watching television (PHQ Adolescent also includes...like school work): 0 Moving or speaking so slowly that other people could have noticed. Or the opposite - being so fidgety or restless that you have been moving around a lot more than usual: 0 Thoughts that you would be better off dead, or of hurting yourself in some way: 0 PHQ-9 Total Score: 1 If you checked off any problems, how difficult have these problems made it for you to do your work, take care of things at home, or get along with other people?: Not difficult at all     Goals Addressed               This Visit's Progress     Remain active (pt-stated)               Objective:    Today's Vitals   07/27/24 0908  BP: 128/62  Pulse: 71  Temp: 97.7 F (36.5 C)  TempSrc: Oral  SpO2: 95%  Weight: 134 lb 6.4 oz (61 kg)  Height:  5' 2 (1.575 m)   Body mass index is 24.58 kg/m.  Hearing/Vision screen Hearing Screening - Comments:: Wears Hearing Aids Vision Screening - Comments:: Wears rx glasses - up to date with routine eye exams with  Dr Octavia Immunizations and Health Maintenance Health Maintenance  Topic Date Due   DTaP/Tdap/Td (3 - Tdap) 11/03/2017   COVID-19 Vaccine (11 - Moderna risk 2025-26 season) 10/20/2024   Medicare Annual Wellness (AWV)  07/27/2025   Pneumococcal Vaccine: 50+ Years  Completed   Influenza Vaccine  Completed   Bone Density Scan  Completed   Hepatitis C Screening  Completed   Zoster Vaccines- Shingrix  Completed   Meningococcal B Vaccine  Aged Out   Mammogram  Discontinued    Colonoscopy  Discontinued        Assessment/Plan:  This is a routine wellness examination for Doris Barton.  Patient Care Team: Mercer Clotilda SAUNDERS, MD as PCP - General (Family Medicine)  I have personally reviewed and noted the following in the patients chart:   Medical and social history Use of alcohol , tobacco or illicit drugs  Current medications and supplements including opioid prescriptions. Functional ability and status Nutritional status Physical activity Advanced directives List of other physicians Hospitalizations, surgeries, and ER visits in previous 12 months Vitals Screenings to include cognitive, depression, and falls Referrals and appointments  No orders of the defined types were placed in this encounter.  In addition, I have reviewed and discussed with patient certain preventive protocols, quality metrics, and best practice recommendations. A written personalized care plan for preventive services as well as general preventive health recommendations were provided to patient.   Rojelio LELON Blush, LPN   [id number]   Return in 53 weeks (on 08/02/2025).  After Visit Summary: (In Person-Printed) AVS printed and given to the patient  Nurse Notes: No voiced or noted concerns at this time "

## 2025-08-02 ENCOUNTER — Ambulatory Visit
